# Patient Record
Sex: Male | Born: 1955
Health system: Southern US, Community
[De-identification: ages and names within clinical notes are randomized; demographics above are authoritative.]

## PROBLEM LIST (undated history)

## (undated) DIAGNOSIS — E785 Hyperlipidemia, unspecified: Secondary | ICD-10-CM

## (undated) DIAGNOSIS — G709 Myoneural disorder, unspecified: Secondary | ICD-10-CM

## (undated) DIAGNOSIS — C61 Malignant neoplasm of prostate: Secondary | ICD-10-CM

## (undated) DIAGNOSIS — I255 Ischemic cardiomyopathy: Secondary | ICD-10-CM

## (undated) DIAGNOSIS — Z87891 Personal history of nicotine dependence: Secondary | ICD-10-CM

## (undated) DIAGNOSIS — I2510 Atherosclerotic heart disease of native coronary artery without angina pectoris: Secondary | ICD-10-CM

## (undated) DIAGNOSIS — R001 Bradycardia, unspecified: Secondary | ICD-10-CM

## (undated) DIAGNOSIS — E781 Pure hyperglyceridemia: Secondary | ICD-10-CM

---

## 1999-04-17 ENCOUNTER — Encounter: Payer: Self-pay | Admitting: Orthopedic Surgery

## 1999-04-17 ENCOUNTER — Ambulatory Visit (HOSPITAL_COMMUNITY): Admission: RE | Admit: 1999-04-17 | Discharge: 1999-04-17 | Payer: Self-pay | Admitting: Orthopedic Surgery

## 2005-04-22 ENCOUNTER — Ambulatory Visit (HOSPITAL_COMMUNITY): Admission: RE | Admit: 2005-04-22 | Discharge: 2005-04-22 | Payer: Self-pay | Admitting: Orthopedic Surgery

## 2006-11-20 ENCOUNTER — Ambulatory Visit (HOSPITAL_COMMUNITY): Admission: RE | Admit: 2006-11-20 | Discharge: 2006-11-20 | Payer: Self-pay | Admitting: Family Medicine

## 2010-09-13 ENCOUNTER — Ambulatory Visit (HOSPITAL_COMMUNITY): Admission: RE | Admit: 2010-09-13 | Discharge: 2010-09-13 | Payer: Self-pay | Admitting: Family Medicine

## 2010-12-25 HISTORY — PX: CERVICAL SPINE SURGERY: SHX589

## 2011-06-11 ENCOUNTER — Telehealth: Payer: Self-pay

## 2011-06-11 DIAGNOSIS — Z139 Encounter for screening, unspecified: Secondary | ICD-10-CM

## 2011-06-11 MED ORDER — PEG 3350-KCL-NA BICARB-NACL 420 G PO SOLR: ORAL | Status: AC

## 2011-06-11 NOTE — Telephone Encounter (Signed)
Gastroenterology Pre-Procedure Form  Request Date: 06/11/2011     Requesting Physician: Dr. Sherwood Gambler     PATIENT INFORMATION:  Sean Paul is a 55 y.o., male (DOB=14-Apr-1956).  PROCEDURE: Procedure(s) requested: colonoscopy Procedure Reason: screening for colon cancer  PATIENT REVIEW QUESTIONS: The patient reports the following:   1. Diabetes Melitis: no 2. Joint replacements in the past 12 months: no 3. Major health problems in the past 3 months: no 4. Has an artificial valve or MVP:no 5. Has been advised in past to take antibiotics in advance of a procedure like teeth cleaning: no}    MEDICATIONS & ALLERGIES:    Patient reports the following regarding taking any blood thinners:   Plavix? no Aspirin?no Coumadin?  no  Patient confirms/reports the following medications:  Current Outpatient Prescriptions  Medication Sig Dispense Refill  . ALPRAZolam (XANAX) 1 MG tablet Take 1 mg by mouth at bedtime as needed.        Marland Kitchen ibuprofen (ADVIL,MOTRIN) 200 MG tablet Take 200 mg by mouth every 6 (six) hours as needed.        Marland Kitchen UNKNOWN TO PATIENT TESTOSTERONE INJECTION MONTHLY       . polyethylene glycol-electrolytes (TRILYTE) 420 G solution Use as directed Also buy 1 fleet enema & 4 dulcolax tablets to use as directed  4000 mL  0    Patient confirms/reports the following allergies:  Allergies  Allergen Reactions  . Penicillins Itching and Swelling    Patient is appropriate to schedule for requested procedure(s): yes  AUTHORIZATION INFORMATION Primary Insurance: ,  ID #:   Group #:  Pre-Cert / Auth required: Pre-Cert / Auth #:  Secondary Insurance: ,  ID #: ,  Group #:  Pre-Cert / Auth required: Pre-Cert / Auth #:  Orders Placed This Encounter  Procedures  . Procedural/ Surgical Case Request: COLONOSCOPY    Standing Status: Future     Number of Occurrences:      Standing Expiration Date: 06/10/2012    Order Specific Question:  Pre-op diagnosis    Answer:  N/A     SCHEDULE INFORMATION: Procedure has been scheduled as follows:  Date: 06/17/2011     Time: 10:15 AM  Location: [facility] Short Stay  This Gastroenterology Pre-Precedure Form is being routed to the following provider(s) for review: Lorenza Burton, NP

## 2011-06-11 NOTE — Telephone Encounter (Signed)
OK for colonoscopy.  

## 2011-06-14 MED ORDER — SODIUM CHLORIDE 0.45 % IV SOLN
Freq: Once | INTRAVENOUS | Status: AC
  Administered 2011-06-17: 10:00:00 via INTRAVENOUS

## 2011-06-17 ENCOUNTER — Ambulatory Visit (HOSPITAL_COMMUNITY)
Admission: RE | Admit: 2011-06-17 | Discharge: 2011-06-17 | Disposition: A | Discharge status: Home / Self Care | Payer: 59 | Source: Ambulatory Visit | Attending: Internal Medicine | Admitting: Internal Medicine

## 2011-06-17 ENCOUNTER — Encounter (HOSPITAL_COMMUNITY)
Admission: RE | Disposition: A | Discharge status: Home / Self Care | Payer: Self-pay | Source: Ambulatory Visit | Attending: Internal Medicine

## 2011-06-17 ENCOUNTER — Encounter (HOSPITAL_COMMUNITY): Payer: Self-pay | Admitting: *Deleted

## 2011-06-17 DIAGNOSIS — Z1211 Encounter for screening for malignant neoplasm of colon: Secondary | ICD-10-CM

## 2011-06-17 HISTORY — PX: COLONOSCOPY: SHX5424

## 2011-06-17 HISTORY — DX: Myoneural disorder, unspecified: G70.9

## 2011-06-17 SURGERY — COLONOSCOPY
Anesthesia: Moderate Sedation

## 2011-06-17 MED ORDER — MIDAZOLAM HCL 5 MG/5ML IJ SOLN
INTRAMUSCULAR | Status: AC
  Filled 2011-06-17: qty 10

## 2011-06-17 MED ORDER — MIDAZOLAM HCL 5 MG/5ML IJ SOLN
INTRAMUSCULAR | Status: DC | PRN
  Administered 2011-06-17 (×3): 2 mg via INTRAVENOUS

## 2011-06-17 MED ORDER — MEPERIDINE HCL 100 MG/ML IJ SOLN
INTRAMUSCULAR | Status: AC
  Filled 2011-06-17: qty 2

## 2011-06-17 MED ORDER — MEPERIDINE HCL 100 MG/ML IJ SOLN
INTRAMUSCULAR | Status: DC | PRN
  Administered 2011-06-17 (×3): 25 mg via INTRAVENOUS
  Administered 2011-06-17: 50 mg via INTRAVENOUS

## 2011-06-17 NOTE — H&P (Signed)
  Primary Care Physician:  No primary provider on file. Primary Gastroenterologist:  Dr.   Pre-Procedure History & Physical: HPI:  Sean Paul is a 55 y.o. male is here for his first ever a screening colonoscopy. No bowel symptoms. No family history of polyps or colon cancer.  Past Medical History  Diagnosis Date  . Neuromuscular disorder     Past Surgical History  Procedure Date  . Cervical spine surgery 12/25/2010    Prior to Admission medications   Medication Sig Start Date End Date Taking? Authorizing Provider  ALPRAZolam Prudy Feeler) 1 MG tablet Take 1 mg by mouth at bedtime as needed.     Yes Historical Provider, MD  UNKNOWN TO PATIENT TESTOSTERONE INJECTION MONTHLY    Yes Historical Provider, MD  ibuprofen (ADVIL,MOTRIN) 200 MG tablet Take 200 mg by mouth every 6 (six) hours as needed.      Historical Provider, MD    Allergies as of 06/11/2011 - Review Complete 06/11/2011  Allergen Reaction Noted  . Penicillins Itching and Swelling 06/11/2011    History reviewed. No pertinent family history.  History   Social History  . Marital Status: Single    Spouse Name: N/A    Number of Children: N/A  . Years of Education: N/A   Occupational History  . Not on file.   Social History Main Topics  . Smoking status: Not on file  . Smokeless tobacco: Not on file  . Alcohol Use: Yes  . Drug Use: No  . Sexually Active:    Other Topics Concern  . Not on file   Social History Narrative  . No narrative on file    Review of Systems: See HPI, otherwise negative ROS  Physical Exam: BP 119/72  Pulse 78  Temp(Src) 97.8 F (36.6 C) (Oral)  Resp 18  Ht 5\' 11"  (1.803 m)  Wt 179 lb (81.194 kg)  BMI 24.97 kg/m2  SpO2 97% General:   Alert,  Well-developed, well-nourished, pleasant and cooperative in NAD Head:  Normocephalic and atraumatic. Eyes:  Sclera clear, no icterus.   Conjunctiva pink. Ears:  Normal auditory acuity. Nose:  No deformity, discharge,  or  lesions. Mouth:  No deformity or lesions, dentition normal. Neck:  Supple; no masses or thyromegaly. Lungs:  Clear throughout to auscultation.   No wheezes, crackles, or rhonchi. No acute distress. Heart:  Regular rate and rhythm; no murmurs, clicks, rubs,  or gallops. Abdomen:  Soft, nontender and nondistended. No masses, hepatosplenomegaly or hernias noted. Normal bowel sounds, without guarding, and without rebound.   Msk:  Symmetrical without gross deformities. Normal posture. Pulses:  Normal pulses noted. Extremities:  Without clubbing or edema. Neurologic:  Alert and  oriented x4;  grossly normal neurologically. Skin:  Intact without significant lesions or rashes. Cervical Nodes:  No significant cervical adenopathy. Psych:  Alert and cooperative. Normal mood and affect.  Impression/Plan: Sean Paul is now here to undergo a average risk screening colonoscopy.  Risks, benefits, limitations, imponderables and alternatives regarding colonoscopy have been reviewed with the patient. Questions have been answered. All parties agreeable.

## 2011-06-20 ENCOUNTER — Encounter (HOSPITAL_COMMUNITY): Payer: Self-pay | Admitting: Internal Medicine

## 2015-06-06 ENCOUNTER — Inpatient Hospital Stay (HOSPITAL_COMMUNITY)
Admission: EM | Admit: 2015-06-06 | Discharge: 2015-06-10 | DRG: 247 | Disposition: A | Discharge status: Home / Self Care | Payer: 59 | Attending: Interventional Cardiology | Admitting: Interventional Cardiology

## 2015-06-06 ENCOUNTER — Encounter (HOSPITAL_COMMUNITY): Payer: Self-pay | Admitting: Emergency Medicine

## 2015-06-06 ENCOUNTER — Emergency Department (HOSPITAL_COMMUNITY): Payer: 59

## 2015-06-06 DIAGNOSIS — Z880 Allergy status to penicillin: Secondary | ICD-10-CM | POA: Diagnosis not present

## 2015-06-06 DIAGNOSIS — I214 Non-ST elevation (NSTEMI) myocardial infarction: Secondary | ICD-10-CM | POA: Diagnosis present

## 2015-06-06 DIAGNOSIS — I255 Ischemic cardiomyopathy: Secondary | ICD-10-CM | POA: Diagnosis present

## 2015-06-06 DIAGNOSIS — R7989 Other specified abnormal findings of blood chemistry: Secondary | ICD-10-CM | POA: Diagnosis not present

## 2015-06-06 DIAGNOSIS — R001 Bradycardia, unspecified: Secondary | ICD-10-CM | POA: Diagnosis present

## 2015-06-06 DIAGNOSIS — I2510 Atherosclerotic heart disease of native coronary artery without angina pectoris: Secondary | ICD-10-CM | POA: Diagnosis present

## 2015-06-06 DIAGNOSIS — I495 Sick sinus syndrome: Secondary | ICD-10-CM | POA: Diagnosis not present

## 2015-06-06 DIAGNOSIS — R778 Other specified abnormalities of plasma proteins: Secondary | ICD-10-CM

## 2015-06-06 DIAGNOSIS — E781 Pure hyperglyceridemia: Secondary | ICD-10-CM | POA: Diagnosis present

## 2015-06-06 DIAGNOSIS — Z981 Arthrodesis status: Secondary | ICD-10-CM

## 2015-06-06 DIAGNOSIS — Z87891 Personal history of nicotine dependence: Secondary | ICD-10-CM | POA: Diagnosis not present

## 2015-06-06 DIAGNOSIS — Z8249 Family history of ischemic heart disease and other diseases of the circulatory system: Secondary | ICD-10-CM | POA: Diagnosis not present

## 2015-06-06 DIAGNOSIS — Z79899 Other long term (current) drug therapy: Secondary | ICD-10-CM

## 2015-06-06 DIAGNOSIS — Z7982 Long term (current) use of aspirin: Secondary | ICD-10-CM

## 2015-06-06 HISTORY — DX: Hyperlipidemia, unspecified: E78.5

## 2015-06-06 HISTORY — DX: Bradycardia, unspecified: R00.1

## 2015-06-06 HISTORY — DX: Atherosclerotic heart disease of native coronary artery without angina pectoris: I25.10

## 2015-06-06 HISTORY — DX: Ischemic cardiomyopathy: I25.5

## 2015-06-06 HISTORY — DX: Personal history of nicotine dependence: Z87.891

## 2015-06-06 HISTORY — DX: Pure hyperglyceridemia: E78.1

## 2015-06-06 LAB — COMPREHENSIVE METABOLIC PANEL
ALBUMIN: 4.7 g/dL (ref 3.5–5.0)
ALK PHOS: 72 U/L (ref 38–126)
ALT: 21 U/L (ref 17–63)
AST: 28 U/L (ref 15–41)
Anion gap: 12 (ref 5–15)
BILIRUBIN TOTAL: 0.6 mg/dL (ref 0.3–1.2)
BUN: 12 mg/dL (ref 6–20)
CHLORIDE: 99 mmol/L — AB (ref 101–111)
CO2: 24 mmol/L (ref 22–32)
Calcium: 9.1 mg/dL (ref 8.9–10.3)
Creatinine, Ser: 0.74 mg/dL (ref 0.61–1.24)
GFR calc non Af Amer: >60 mL/min (ref >60)
GLUCOSE: 155 mg/dL — AB (ref 65–99)
POTASSIUM: 3.7 mmol/L (ref 3.5–5.1)
Sodium: 135 mmol/L (ref 135–145)
TOTAL PROTEIN: 8.3 g/dL — AB (ref 6.5–8.1)

## 2015-06-06 LAB — PROTIME-INR
INR: 1.07 (ref 0.00–1.49)
PROTHROMBIN TIME: 14.1 s (ref 11.6–15.2)

## 2015-06-06 LAB — TROPONIN I
TROPONIN I: 2.1 ng/mL — AB (ref <0.031)
Troponin I: 0.13 ng/mL — ABNORMAL HIGH (ref <0.031)

## 2015-06-06 LAB — CBC
HCT: 41.9 % (ref 39.0–52.0)
HEMOGLOBIN: 14.9 g/dL (ref 13.0–17.0)
MCH: 33.2 pg (ref 26.0–34.0)
MCHC: 35.6 g/dL (ref 30.0–36.0)
MCV: 93.3 fL (ref 78.0–100.0)
PLATELETS: 222 10*3/uL (ref 150–400)
RBC: 4.49 MIL/uL (ref 4.22–5.81)
RDW: 11.6 % (ref 11.5–15.5)
WBC: 8.1 10*3/uL (ref 4.0–10.5)

## 2015-06-06 LAB — APTT: aPTT: 27 seconds (ref 24–37)

## 2015-06-06 MED ORDER — MORPHINE SULFATE 4 MG/ML IJ SOLN
INTRAMUSCULAR | Status: AC
  Filled 2015-06-06: qty 1

## 2015-06-06 MED ORDER — ATORVASTATIN CALCIUM 80 MG PO TABS
80.0000 mg | ORAL_TABLET | Freq: Every day | ORAL | Status: DC
  Administered 2015-06-07 – 2015-06-09 (×3): 80 mg via ORAL
  Filled 2015-06-06 (×4): qty 1

## 2015-06-06 MED ORDER — TIROFIBAN (AGGRASTAT) BOLUS VIA INFUSION
25.0000 ug/kg | Freq: Once | INTRAVENOUS | Status: AC
  Administered 2015-06-07: 2155 ug via INTRAVENOUS
  Filled 2015-06-06: qty 44

## 2015-06-06 MED ORDER — SODIUM CHLORIDE 0.9 % IV SOLN
1000.0000 mL | INTRAVENOUS | Status: DC
  Administered 2015-06-06: 1000 mL via INTRAVENOUS

## 2015-06-06 MED ORDER — ONDANSETRON HCL 4 MG/2ML IJ SOLN: 4.0000 mg | Freq: Four times a day (QID) | INTRAMUSCULAR | Status: DC | PRN

## 2015-06-06 MED ORDER — ACETAMINOPHEN 325 MG PO TABS: 650.0000 mg | ORAL_TABLET | ORAL | Status: DC | PRN

## 2015-06-06 MED ORDER — ENOXAPARIN SODIUM 100 MG/ML ~~LOC~~ SOLN
90.0000 mg | Freq: Two times a day (BID) | SUBCUTANEOUS | Status: DC
  Administered 2015-06-06 – 2015-06-08 (×4): 90 mg via SUBCUTANEOUS
  Filled 2015-06-06 (×8): qty 1

## 2015-06-06 MED ORDER — MORPHINE SULFATE 4 MG/ML IJ SOLN
4.0000 mg | Freq: Once | INTRAMUSCULAR | Status: AC
  Administered 2015-06-06: 4 mg via INTRAVENOUS

## 2015-06-06 MED ORDER — NITROGLYCERIN IN D5W 200-5 MCG/ML-% IV SOLN
20.0000 ug/min | INTRAVENOUS | Status: DC
  Administered 2015-06-07: 20 ug/min via INTRAVENOUS
  Filled 2015-06-06: qty 250

## 2015-06-06 MED ORDER — NITROGLYCERIN 2 % TD OINT
1.0000 [in_us] | TOPICAL_OINTMENT | Freq: Once | TRANSDERMAL | Status: AC
  Administered 2015-06-06: 1 [in_us] via TOPICAL
  Filled 2015-06-06: qty 1

## 2015-06-06 MED ORDER — METOPROLOL TARTRATE 12.5 MG HALF TABLET
12.5000 mg | ORAL_TABLET | Freq: Two times a day (BID) | ORAL | Status: DC
  Administered 2015-06-07 (×3): 12.5 mg via ORAL
  Filled 2015-06-06 (×7): qty 1

## 2015-06-06 MED ORDER — NITROGLYCERIN 0.4 MG SL SUBL
0.4000 mg | SUBLINGUAL_TABLET | SUBLINGUAL | Status: DC | PRN
Start: 2015-06-06 — End: 2015-06-10
  Administered 2015-06-06: 0.4 mg via SUBLINGUAL
  Filled 2015-06-06: qty 1

## 2015-06-06 MED ORDER — ASPIRIN EC 81 MG PO TBEC
81.0000 mg | DELAYED_RELEASE_TABLET | Freq: Every day | ORAL | Status: DC
  Administered 2015-06-08 – 2015-06-10 (×2): 81 mg via ORAL
  Filled 2015-06-06 (×5): qty 1

## 2015-06-06 MED ORDER — TIROFIBAN HCL IV 5 MG/100ML
0.1500 ug/kg/min | INTRAVENOUS | Status: DC
  Administered 2015-06-07 – 2015-06-09 (×7): 0.15 ug/kg/min via INTRAVENOUS
  Filled 2015-06-06 (×24): qty 100

## 2015-06-06 MED ORDER — ASPIRIN 81 MG PO CHEW
324.0000 mg | CHEWABLE_TABLET | Freq: Once | ORAL | Status: AC
  Administered 2015-06-06: 324 mg via ORAL
  Filled 2015-06-06: qty 4

## 2015-06-06 NOTE — Progress Notes (Signed)
ANTICOAGULATION CONSULT NOTE - Initial Consult  Pharmacy Consult for lovenox Indication: chest pain/ACS  Allergies  Allergen Reactions  . Penicillins Itching and Swelling    Patient Measurements: Height: 5\' 10"  (177.8 cm) Weight: 190 lb (86.183 kg) IBW/kg (Calculated) : 73  Vital Signs: Temp: 97.5 F (36.4 C) (08/09 1747) Temp Source: Oral (08/09 1747) BP: 112/71 mmHg (08/09 1900) Pulse Rate: 58 (08/09 1900)  Labs:  Recent Labs  06/06/15 1808  HGB 14.9  HCT 41.9  PLT 222  APTT 27  LABPROT 14.1  INR 1.07  CREATININE 0.74  TROPONINI 0.13*    Estimated Creatinine Clearance: 101.4 mL/min (by C-G formula based on Cr of 0.74).   Medical History: Past Medical History  Diagnosis Date  . Neuromuscular disorder     Medications:  See medication history  Assessment: 59 yo man to start lovenox for CP. Goal of Therapy:  Anti-Xa level 0.6-1 units/ml 4hrs after LMWH dose given Monitor platelets by anticoagulation protocol: Yes   Plan:  Lovenox 90 mg sq q12 hours F/u CBC  Nyheem Binette Poteet 06/06/2015,7:57 PM

## 2015-06-06 NOTE — Progress Notes (Signed)
HPI: 59 yo man with hypertryglyceridemia and remote tobacco use (quit 25 yrs ago) presented to [facility] ED with CP.  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  These episodes resolved quickly with rest and he attributed to over-exertion.  However, today around 1:30 he developed moderate chest discomfort.  Described as heavy/tight feeling center, lower chest.  Associated dyspnea and nausea while working.  This resolved after about 20 minutes.  However, once he got to working again pain recurred and this time lasted a few hours with mild intensity.  He drove home from [facility] and presented to [facility] ED.  There, his ECG showed SR with possible inferior Qs albeit small and mildly elevated troponon of 0.016 prompting request for transfer.    On arrival here, he reports mild recurrence of his chest discomfort.  Troponin up to 2.  ECG with inferior Q waves and TWI that are new from previous.     Review of Systems:     Cardiac Review of Systems: {Y] = yes [ ]  = no  Chest Pain [x    ]  Resting SOB [x   ] Exertional SOB  [x  ]  Orthopnea [  ]   Pedal Edema [   ]    Palpitations [  ] Syncope  [  ]   Presyncope [   ]  General Review of Systems: [Y] = yes [  ]=no Constitional: recent weight change [  ]; anorexia [  ]; fatigue [  ]; nausea [  ]; night sweats [  ]; fever [  ]; or chills [  ];                                                                      Dental: poor dentition[  ];   Eye : blurred vision [  ]; diplopia [   ]; vision changes [  ];  Amaurosis fugax[  ]; Resp: cough [  ];  wheezing[  ];  hemoptysis[  ]; shortness of breath[  ]; paroxysmal nocturnal dyspnea[  ]; dyspnea on exertion[  ]; or orthopnea[  ];  GI:  gallstones[  ], vomiting[  ];  dysphagia[  ]; melena[  ];  hematochezia [  ]; heartburn[  ];   GU: kidney stones [  ]; hematuria[  ];    dysuria [  ];  nocturia[  ];               Skin: rash [  ], swelling[  ];, hair loss[  ];  peripheral edema[  ];  or itching[  ]; Musculosketetal: myalgias[  ];  joint swelling[  ];  joint erythema[  ];  joint pain[  ];  back pain[  ];  Heme/Lymph: bruising[  ];  bleeding[  ];  anemia[  ];  Neuro: TIA[  ];  headaches[  ];  stroke[  ];  vertigo[  ];  seizures[  ];   paresthesias[  ];  difficulty walking[  ];  Psych:depression[  ]; anxiety[  ];  Endocrine: diabetes[  ];  thyroid dysfunction[  ];  Other:  Past Medical History  Diagnosis Date  . Neuromuscular disorder     prior cervical fusion    No current facility-administered medications on file prior to encounter.   No current outpatient prescriptions on file prior to encounter.     Allergies  Allergen Reactions  . Penicillins Itching and Swelling    History   Social History  . Marital Status: Single    Spouse Name: N/A  . Number of Children: N/A  . Years of Education: N/A   Occupational History  . Not on file.   Social History Main Topics  . Smoking status: Former Smoker    Types: Cigarettes  . Smokeless tobacco: Not on file  . Alcohol Use: Yes  . Drug Use: No  . Sexual Activity: Not on file   Other Topics Concern  . Not on file   Social History Narrative    Family History  Problem Relation Age of Onset  . CAD Brother     PHYSICAL EXAM: Filed Vitals:   06/06/15 2250  BP: 107/64  Pulse: 75  Temp: 97.7 F (36.5 C)  Resp: 16   General:  Well appearing. No respiratory difficulty HEENT: normal Neck: supple. no JVD. Carotids 2+ bilat; no bruits. No lymphadenopathy or thryomegaly appreciated. Cor: PMI nondisplaced. Regular rate & rhythm. No rubs, gallops or murmurs. Lungs: clear Abdomen: soft, nontender, nondistended. No hepatosplenomegaly. No bruits or masses. Good bowel sounds. Extremities: no cyanosis, clubbing, rash, edema Neuro: alert & oriented x 3, cranial nerves grossly intact. moves all 4  extremities w/o difficulty. Affect pleasant.  ECG: SR with inferior Q in III and aVF and new TWI inferior leads  Results for orders placed or performed during the hospital encounter of 06/06/15 (from the past 24 hour(s))  APTT     Status: None   Collection Time: 06/06/15  6:08 PM  Result Value Ref Range   aPTT 27 24 - 37 seconds  CBC     Status: None   Collection Time: 06/06/15  6:08 PM  Result Value Ref Range   WBC 8.1 4.0 - 10.5 K/uL   RBC 4.49 4.22 - 5.81 MIL/uL   Hemoglobin 14.9 13.0 - 17.0 g/dL   HCT 41.9 39.0 - 52.0 %   MCV 93.3 78.0 - 100.0 fL   MCH 33.2 26.0 - 34.0 pg   MCHC 35.6 30.0 - 36.0 g/dL   RDW 11.6 11.5 - 15.5 %   Platelets 222 150 - 400 K/uL  Comprehensive metabolic panel     Status: Abnormal   Collection Time: 06/06/15  6:08 PM  Result Value Ref Range   Sodium 135 135 - 145 mmol/L   Potassium 3.7 3.5 - 5.1 mmol/L   Chloride 99 (L) 101 - 111 mmol/L   CO2 24 22 - 32 mmol/L   Glucose, Bld 155 (H) 65 - 99 mg/dL   BUN 12 6 - 20 mg/dL   Creatinine, Ser 0.74 0.61 - 1.24 mg/dL   Calcium 9.1 8.9 - 10.3 mg/dL   Total Protein 8.3 (H) 6.5 - 8.1 g/dL   Albumin 4.7 3.5 - 5.0 g/dL   AST 28 15 - 41 U/L   ALT 21 17 - 63 U/L   Alkaline Phosphatase 72 38 - 126 U/L   Total Bilirubin 0.6 0.3 - 1.2 mg/dL   GFR calc non Af Amer >60 >60 mL/min   GFR calc Af Amer >60 >60 mL/min   Anion gap 12 5 -  15  Protime-INR     Status: None   Collection Time: 06/06/15  6:08 PM  Result Value Ref Range   Prothrombin Time 14.1 11.6 - 15.2 seconds   INR 1.07 0.00 - 1.49  Troponin I  (0, 3, 6)     Status: Abnormal   Collection Time: 06/06/15  6:08 PM  Result Value Ref Range   Troponin I 0.13 (H) <0.031 ng/mL  Troponin I  (0, 3, 6)     Status: Abnormal   Collection Time: 06/06/15  8:50 PM  Result Value Ref Range   Troponin I 2.10 (HH) <0.031 ng/mL   Dg Chest Portable 1 View  06/06/2015   CLINICAL DATA:  Chest pain. Central chest pressure for 1 week. Pressure became pain today. Initial  encounter. Intermittent shortness of breath. Nausea.  EXAM: PORTABLE CHEST - 1 VIEW  COMPARISON:  None.  FINDINGS: Cardiopericardial silhouette within normal limits. Mediastinal contours normal. Trachea midline. No airspace disease or effusion. Monitoring leads project over the chest. Lower cervical ACDF.  IMPRESSION: No active disease.   Electronically Signed   By: Dereck Ligas M.D.   On: 06/06/2015 18:16     ASSESSMENT: 59 yo man with hyper-TGemia and remote tobacco use with 1 week of UA presenting with longer duration of chest discomfort, dynamic ECG changes and rising troponin c/w NSTEMI  PLAN/DISCUSSION: ASA, full dose lovenox given and will continue Will initiate GP IIb/IIIa inhibitor given ongoing chest discomfort and ECG changes Initiate NTG drip, may be limited by BP Low dose BB (metop 12.5) High dose statin Risk stratify Echo tomorrow NPO for cath in AM, sooner if pain not resolved with medical therapy Plan discussed with Dr. Irish Lack given potential for cath tonight if symptoms resolution not achieved

## 2015-06-06 NOTE — ED Notes (Signed)
Pt c/o of LT sided non-radiating chest pain that began today with SOB. Pt AOx4.

## 2015-06-06 NOTE — ED Notes (Signed)
CRITICAL VALUE ALERT  Critical value received:  Troponin 2.10  Date of notification:  06/06/15  Time of notification:  2153  Critical value read back:Yes.    Nurse who received alert:  Derek Mound, RN  MD notified (1st page):  Tomi Bamberger  Time of first page:  2154  MD notified (2nd page):  Time of second page:  Responding MD:  Tomi Bamberger  Time MD responded:  2155

## 2015-06-06 NOTE — ED Provider Notes (Signed)
CSN: [id number]     Arrival date & time 06/06/15  1732 History  First MD Initiated Contact with Patient 06/06/15 1736     Chief Complaint  Patient presents with  . Chest Pain   Patient is a 59 y.o. male presenting with chest pain. The history is provided by the patient.  Chest Pain Pain location:  Substernal area Pain quality: burning and pressure   Pain radiates to:  Does not radiate Pain radiates to the back: no   Pain severity:  Moderate Duration:  5 hours (He has noticed the pain off and on over the last week.  Usually when exerting himself.  Today it has been worse and has not gone away.) Timing:  Intermittent Progression:  Worsening Chronicity:  Recurrent Context: movement   Relieved by:  Rest Worsened by:  Exertion Ineffective treatments:  None tried Associated symptoms: nausea and shortness of breath   Associated symptoms: no abdominal pain, no anxiety, no back pain, no cough, no fever and not vomiting   Risk factors: high cholesterol   Risk factors: no coronary artery disease, no diabetes mellitus, no hypertension and no prior DVT/PE     Past Medical History  Diagnosis Date  . Neuromuscular disorder     prior cervical fusion   Past Surgical History  Procedure Laterality Date  . Cervical spine surgery  12/25/2010  . Colonoscopy  06/17/2011    Procedure: COLONOSCOPY;  Surgeon: Daneil Dolin, MD;  Location: [facility]ITE;  Service: Endoscopy;  Laterality: N/A;  10:15 AM   Family History  Problem Relation Age of Onset  . CAD Brother    History  Substance Use Topics  . Smoking status: Former Smoker    Types: Cigarettes  . Smokeless tobacco: Not on file  . Alcohol Use: Yes    Review of Systems  Constitutional: Negative for fever.  Respiratory: Positive for shortness of breath. Negative for cough.   Cardiovascular: Positive for chest pain.  Gastrointestinal: Positive for nausea. Negative for vomiting and abdominal pain.  Musculoskeletal: Negative for back pain.   All other systems reviewed and are negative.     Allergies  Penicillins  Home Medications   Prior to Admission medications   Medication Sig Start Date End Date Taking? Authorizing Provider  allopurinol (ZYLOPRIM) 100 MG tablet Take 50 mg by mouth daily.  05/31/15  Yes Historical Provider, MD  aspirin EC 325 MG tablet Take 325 mg by mouth every other day.   Yes Historical Provider, MD  KRILL OIL PO Take 1 tablet by mouth daily.   Yes Historical Provider, MD  NIASPAN 1000 MG CR tablet Take 1,000 mg by mouth daily. 05/15/15  Yes Historical Provider, MD   BP 112/71 mmHg  Pulse 58  Temp(Src) 97.5 F (36.4 C) (Oral)  Resp 12  Ht 5\' 10"  (1.778 m)  Wt 190 lb (86.183 kg)  BMI 27.26 kg/m2  SpO2 98% Physical Exam  Constitutional: He appears well-developed and well-nourished. No distress.  HENT:  Head: Normocephalic and atraumatic.  Right Ear: External ear normal.  Left Ear: External ear normal.  Eyes: Conjunctivae are normal. Right eye exhibits no discharge. Left eye exhibits no discharge. No scleral icterus.  Neck: Neck supple. No tracheal deviation present.  Cardiovascular: Normal rate, regular rhythm and intact distal pulses.   Pulmonary/Chest: Effort normal and breath sounds normal. No stridor. No respiratory distress. He has no wheezes. He has no rales.  Abdominal: Soft. Bowel sounds are normal. He exhibits no distension. There  is no tenderness. There is no rebound and no guarding.  Musculoskeletal: He exhibits no edema or tenderness.  Neurological: He is alert. He has normal strength. No cranial nerve deficit (no facial droop, extraocular movements intact, no slurred speech) or sensory deficit. He exhibits normal muscle tone. He displays no seizure activity. Coordination normal.  Skin: Skin is warm and dry. No rash noted.  Psychiatric: He has a normal mood and affect.  Nursing note and vitals reviewed.   ED Course  Procedures (including critical care time) Labs Review Labs  Reviewed  COMPREHENSIVE METABOLIC PANEL - Abnormal; Notable for the following:    Chloride 99 (*)    Glucose, Bld 155 (*)    Total Protein 8.3 (*)    All other components within normal limits  TROPONIN I - Abnormal; Notable for the following:    Troponin I 0.13 (*)    All other components within normal limits  APTT  CBC  PROTIME-INR  TROPONIN I  TROPONIN I    Imaging Review Dg Chest Portable 1 View  06/06/2015   CLINICAL DATA:  Chest pain. Central chest pressure for 1 week. Pressure became pain today. Initial encounter. Intermittent shortness of breath. Nausea.  EXAM: PORTABLE CHEST - 1 VIEW  COMPARISON:  None.  FINDINGS: Cardiopericardial silhouette within normal limits. Mediastinal contours normal. Trachea midline. No airspace disease or effusion. Monitoring leads project over the chest. Lower cervical ACDF.  IMPRESSION: No active disease.   Electronically Signed   By: Dereck Ligas M.D.   On: 06/06/2015 18:16     EKG Interpretation   Date/Time:  Tuesday June 06 2015 17:47:12 EDT Ventricular Rate:  71 PR Interval:  141 QRS Duration: 128 QT Interval:  408 QTC Calculation: 443 R Axis:   73 Text Interpretation:  Sinus rhythm Left bundle branch block Baseline  wander in lead(s) I III V3 similar appearance to prior ECG except for  increased qrs duration Confirmed by Esha Fincher  MD-J, Maiah Sinning ([id number]) on 06/06/2015  5:55:59 PM     Medications  0.9 %  sodium chloride infusion (1,000 mLs Intravenous New Bag/Given 06/06/15 1802)  nitroGLYCERIN (NITROSTAT) SL tablet 0.4 mg (not administered)  enoxaparin (LOVENOX) injection 90 mg (90 mg Subcutaneous Given 06/06/15 2003)  aspirin chewable tablet 324 mg (324 mg Oral Given 06/06/15 1802)  nitroGLYCERIN (NITROGLYN) 2 % ointment 1 inch (1 inch Topical Given 06/06/15 1802)    MDM   Final diagnoses:  NSTEMI (non-ST elevated myocardial infarction)    The patient no longer has any chest pain after he was given aspirin and nitroglycerin. Patient's  laboratory tests do show an elevated troponin of 0.13. I'm concerned about the possibility of unstable angina as well as a non-ST elevation myocardial infarction. The patient will need to be admitted to the hospital for further testing and evaluation.  D/w Dr Philbert Riser.  Will plan on transfer to [facility] hospital for admission.    Dorie Rank, MD 06/06/15 2017

## 2015-06-07 ENCOUNTER — Encounter (HOSPITAL_COMMUNITY)
Admission: EM | Disposition: A | Discharge status: Home / Self Care | Payer: Self-pay | Source: Home / Self Care | Attending: Interventional Cardiology

## 2015-06-07 ENCOUNTER — Encounter (HOSPITAL_COMMUNITY): Payer: Self-pay | Admitting: *Deleted

## 2015-06-07 DIAGNOSIS — I2510 Atherosclerotic heart disease of native coronary artery without angina pectoris: Secondary | ICD-10-CM

## 2015-06-07 DIAGNOSIS — I495 Sick sinus syndrome: Secondary | ICD-10-CM

## 2015-06-07 DIAGNOSIS — E781 Pure hyperglyceridemia: Secondary | ICD-10-CM

## 2015-06-07 HISTORY — PX: CARDIAC CATHETERIZATION: SHX172

## 2015-06-07 LAB — TSH: TSH: 1.076 u[IU]/mL (ref 0.350–4.500)

## 2015-06-07 LAB — BASIC METABOLIC PANEL
Anion gap: 8 (ref 5–15)
BUN: 7 mg/dL (ref 6–20)
CALCIUM: 8.4 mg/dL — AB (ref 8.9–10.3)
CO2: 22 mmol/L (ref 22–32)
Chloride: 105 mmol/L (ref 101–111)
Creatinine, Ser: 0.64 mg/dL (ref 0.61–1.24)
GFR calc Af Amer: >60 mL/min (ref >60)
GLUCOSE: 105 mg/dL — AB (ref 65–99)
POTASSIUM: 3.3 mmol/L — AB (ref 3.5–5.1)
Sodium: 135 mmol/L (ref 135–145)

## 2015-06-07 LAB — TROPONIN I
TROPONIN I: 22.86 ng/mL — AB (ref <0.031)
Troponin I: 5.47 ng/mL (ref <0.031)
Troponin I: 15.59 ng/mL (ref <0.031)

## 2015-06-07 LAB — CBC
HEMATOCRIT: 34.8 % — AB (ref 39.0–52.0)
Hemoglobin: 12.2 g/dL — ABNORMAL LOW (ref 13.0–17.0)
MCH: 33 pg (ref 26.0–34.0)
MCHC: 35.1 g/dL (ref 30.0–36.0)
MCV: 94.1 fL (ref 78.0–100.0)
Platelets: 183 10*3/uL (ref 150–400)
RBC: 3.7 MIL/uL — ABNORMAL LOW (ref 4.22–5.81)
RDW: 11.9 % (ref 11.5–15.5)
WBC: 6 10*3/uL (ref 4.0–10.5)

## 2015-06-07 LAB — MRSA PCR SCREENING: MRSA by PCR: NEGATIVE

## 2015-06-07 SURGERY — LEFT HEART CATH AND CORONARY ANGIOGRAPHY
Anesthesia: LOCAL

## 2015-06-07 MED ORDER — IOHEXOL 350 MG/ML SOLN
INTRAVENOUS | Status: DC | PRN
  Administered 2015-06-07: 65 mL via INTRA_ARTERIAL

## 2015-06-07 MED ORDER — FENTANYL CITRATE (PF) 100 MCG/2ML IJ SOLN
INTRAMUSCULAR | Status: DC | PRN
  Administered 2015-06-07: 25 ug via INTRAVENOUS

## 2015-06-07 MED ORDER — MIDAZOLAM HCL 2 MG/2ML IJ SOLN
INTRAMUSCULAR | Status: DC | PRN
  Administered 2015-06-07: 1 mg via INTRAVENOUS

## 2015-06-07 MED ORDER — ONDANSETRON HCL 4 MG/2ML IJ SOLN: 4.0000 mg | Freq: Four times a day (QID) | INTRAMUSCULAR | Status: DC | PRN

## 2015-06-07 MED ORDER — MIDAZOLAM HCL 2 MG/2ML IJ SOLN
INTRAMUSCULAR | Status: AC
  Filled 2015-06-07: qty 4

## 2015-06-07 MED ORDER — SODIUM CHLORIDE 0.9 % WEIGHT BASED INFUSION
1.0000 mL/kg/h | INTRAVENOUS | Status: AC
  Administered 2015-06-07: 1 mL/kg/h via INTRAVENOUS

## 2015-06-07 MED ORDER — SODIUM CHLORIDE 0.9 % IJ SOLN
3.0000 mL | INTRAMUSCULAR | Status: DC | PRN
Start: 2015-06-07 — End: 2015-06-10

## 2015-06-07 MED ORDER — SODIUM CHLORIDE 0.9 % IV SOLN
INTRAVENOUS | Status: DC
  Administered 2015-06-07 – 2015-06-08 (×2): via INTRAVENOUS

## 2015-06-07 MED ORDER — VERAPAMIL HCL 2.5 MG/ML IV SOLN
INTRAVENOUS | Status: AC
  Filled 2015-06-07: qty 2

## 2015-06-07 MED ORDER — ASPIRIN 81 MG PO CHEW
81.0000 mg | CHEWABLE_TABLET | Freq: Every day | ORAL | Status: DC
  Administered 2015-06-07: 81 mg via ORAL
  Filled 2015-06-07: qty 1

## 2015-06-07 MED ORDER — HEPARIN SODIUM (PORCINE) 1000 UNIT/ML IJ SOLN
INTRAMUSCULAR | Status: AC
  Filled 2015-06-07: qty 1

## 2015-06-07 MED ORDER — NITROGLYCERIN 1 MG/10 ML FOR IR/CATH LAB
INTRA_ARTERIAL | Status: AC
  Filled 2015-06-07: qty 10

## 2015-06-07 MED ORDER — HEPARIN SODIUM (PORCINE) 1000 UNIT/ML IJ SOLN
INTRAMUSCULAR | Status: DC | PRN
  Administered 2015-06-07: 4500 [IU] via INTRAVENOUS

## 2015-06-07 MED ORDER — VERAPAMIL HCL 2.5 MG/ML IV SOLN
INTRAVENOUS | Status: DC | PRN
  Administered 2015-06-07: 03:00:00 via INTRA_ARTERIAL

## 2015-06-07 MED ORDER — SODIUM CHLORIDE 0.9 % IV SOLN: 250.0000 mL | INTRAVENOUS | Status: DC | PRN

## 2015-06-07 MED ORDER — ACETAMINOPHEN 325 MG PO TABS: 650.0000 mg | ORAL_TABLET | ORAL | Status: DC | PRN

## 2015-06-07 MED ORDER — LIDOCAINE HCL (PF) 1 % IJ SOLN
INTRAMUSCULAR | Status: AC
  Filled 2015-06-07: qty 30

## 2015-06-07 MED ORDER — LIDOCAINE HCL (PF) 1 % IJ SOLN
INTRAMUSCULAR | Status: DC | PRN
  Administered 2015-06-07: 03:00:00

## 2015-06-07 MED ORDER — SODIUM CHLORIDE 0.9 % IJ SOLN
3.0000 mL | Freq: Two times a day (BID) | INTRAMUSCULAR | Status: DC
  Administered 2015-06-07 – 2015-06-09 (×4): 3 mL via INTRAVENOUS

## 2015-06-07 MED ORDER — FENTANYL CITRATE (PF) 100 MCG/2ML IJ SOLN
INTRAMUSCULAR | Status: AC
  Filled 2015-06-07: qty 4

## 2015-06-07 MED ORDER — ISOSORBIDE MONONITRATE 15 MG HALF TABLET
15.0000 mg | ORAL_TABLET | Freq: Every day | ORAL | Status: DC
  Administered 2015-06-07: 15 mg via ORAL
  Filled 2015-06-07 (×2): qty 1

## 2015-06-07 SURGICAL SUPPLY — 12 items
CATH INFINITI 5 FR JL3.5 (CATHETERS) ×2 IMPLANT
CATH INFINITI 5FR ANG PIGTAIL (CATHETERS) ×2 IMPLANT
CATH INFINITI JR4 5F (CATHETERS) ×2 IMPLANT
DEVICE RAD COMP TR BAND LRG (VASCULAR PRODUCTS) ×2 IMPLANT
ELECT DEFIB PAD ADLT CADENCE (PAD) ×1 IMPLANT
GLIDESHEATH SLEND SS 6F .021 (SHEATH) ×2 IMPLANT
KIT HEART LEFT (KITS) ×2 IMPLANT
PACK CARDIAC CATHETERIZATION (CUSTOM PROCEDURE TRAY) ×2 IMPLANT
SYR MEDRAD MARK V 150ML (SYRINGE) ×2 IMPLANT
TRANSDUCER W/STOPCOCK (MISCELLANEOUS) ×2 IMPLANT
TUBING CIL FLEX 10 FLL-RA (TUBING) ×2 IMPLANT
WIRE SAFE-T 1.5MM-J .035X260CM (WIRE) ×2 IMPLANT

## 2015-06-07 NOTE — Progress Notes (Signed)
Subjective:  Day 1 s/p NSTEMI; no chest pain  Objective:   Vital Signs : Filed Vitals:   06/07/15 0500 06/07/15 0730 06/07/15 0745 06/07/15 0800  BP:  '94/49 99/50 95/56 '  Pulse:  55 55 61  Temp: 97.9 F (36.6 C) 97.7 F (36.5 C)    TempSrc: Oral Axillary    Resp:  '9 10 14  ' Height:      Weight: 175 lb 14.8 oz (79.8 kg)     SpO2:  99% 99% 100%    Intake/Output from previous day:  Intake/Output Summary (Last 24 hours) at 06/07/15 0827 Last data filed at 06/07/15 0700  Gross per 24 hour  Intake  468.1 ml  Output      0 ml  Net  468.1 ml    I/O since admission: +468  Wt Readings from Last 3 Encounters:  06/07/15 175 lb 14.8 oz (79.8 kg)  06/17/11 179 lb (81.194 kg)    Medications: . aspirin  81 mg Oral Daily  . aspirin EC  81 mg Oral Daily  . atorvastatin  80 mg Oral q1800  . enoxaparin (LOVENOX) injection  90 mg Subcutaneous Q12H  . metoprolol tartrate  12.5 mg Oral BID  . sodium chloride  3 mL Intravenous Q12H    . sodium chloride 1,000 mL (06/06/15 1802)  . nitroGLYCERIN 10 mcg/min (06/07/15 0600)  . tirofiban 0.15 mcg/kg/min (06/07/15 0753)    Physical Exam:   General appearance: alert, cooperative and no distress Neck: no adenopathy, no carotid bruit, no JVD, supple, symmetrical, trachea midline and thyroid not enlarged, symmetric, no tenderness/mass/nodules Lungs: clear to auscultation bilaterally Heart: regular rate and rhythm Abdomen: soft, non-tender; bowel sounds normal; no masses,  no organomegaly Extremities: no edema, redness or tenderness in the calves or thighs Pulses: 2+ and symmetric; R radial cath site stable Skin: Skin color, texture, turgor normal. No rashes or lesions Neurologic: Grossly normal   Rate: 52  Rhythm: sinus bradycardia  ECG (independently read by me): SB at 52; inferior Q waves with T wave infersion  Lab Results: BMP Latest Ref Rng 06/06/2015  Glucose 65 - 99 mg/dL 155(H)  BUN 6 - 20 mg/dL 12  Creatinine 0.61 - 1.24  mg/dL 0.74  Sodium 135 - 145 mmol/L 135  Potassium 3.5 - 5.1 mmol/L 3.7  Chloride 101 - 111 mmol/L 99(L)  CO2 22 - 32 mmol/L 24  Calcium 8.9 - 10.3 mg/dL 9.1     Hepatic Function Latest Ref Rng 06/06/2015  Total Protein 6.5 - 8.1 g/dL 8.3(H)  Albumin 3.5 - 5.0 g/dL 4.7  AST 15 - 41 U/L 28  ALT 17 - 63 U/L 21  Alk Phosphatase 38 - 126 U/L 72  Total Bilirubin 0.3 - 1.2 mg/dL 0.6     Recent Labs  06/06/15 1808 06/07/15 0758  WBC 8.1 6.0  HGB 14.9 12.2*  HCT 41.9 34.8*  MCV 93.3 94.1  PLT 222 183   No results found for: TSH  Recent Labs  06/06/15 1808 06/06/15 2050 06/07/15 0104  TROPONINI 0.13* 2.10* 5.47*    No results found for: TSH No results for input(s): HGBA1C in the last 72 hours.   Recent Labs  06/06/15 1808  INR 1.07   BNP (last 3 results) No results for input(s): BNP in the last 8760 hours.  ProBNP (last 3 results) No results for input(s): PROBNP in the last 8760 hours.   Lipid Panel  No results found for: CHOL, TRIG, HDL, CHOLHDL, VLDL, LDLCALC,  LDLDIRECT    Imaging:  Dg Chest Portable 1 View  06/06/2015   CLINICAL DATA:  Chest pain. Central chest pressure for 1 week. Pressure became pain today. Initial encounter. Intermittent shortness of breath. Nausea.  EXAM: PORTABLE CHEST - 1 VIEW  COMPARISON:  None.  FINDINGS: Cardiopericardial silhouette within normal limits. Mediastinal contours normal. Trachea midline. No airspace disease or effusion. Monitoring leads project over the chest. Lower cervical ACDF.  IMPRESSION: No active disease.   Electronically Signed   By: Dereck Ligas M.D.   On: 06/06/2015 18:16   8/9-07/2015 EMERGENT CARDIAC CATHETERIZATION Conclusion     Mid RCA lesion, 99% stenosed with heavy thrombus. The lesion was not previously treated.  Moderate disease in the proximal LAD.  Continue IV IIb/IIIa inhibitors for 48 hours. Recath on Friday to llok for thrombus resolution in the RCA, with subsequent decreased chance of  embolization during PCI. Continue aggressive secondary prevention and antianginal therapy until that time. Lovenox for now until the time of the repeat cath.      Assessment/Plan:   Principal Problem:   NSTEMI (non-ST elevated myocardial infarction) Active Problems:   Elevated troponin   Hypertriglyceridemia  1. Day 1 s/p Inferior NSTEMI with 99% RCA stenosis with heavy thrombus burden.  In retrospect, he had a 1 week h/o exertional chest tightness prior to presenting to [facility] last evening. Troponin increased to 15.59 this am. Now on lovenox 90 s1 q 12 hrs, and continue aggrastat. Tentatively plan PCI on Friday, but sooner if recurrent symptoms. 2. Hypotension at 96/60 presently:   Probably contributed by potential RV involvement and osmotic diuresis from contrast. IV NTG being weaned.  Will resume fluids at 75 cc/hr, and once BP > 100 can initiate low dose oral nitrates as BP allows. 3. Sinus Bradycardia; on low dose metoprolol 12.5 mg bid. 4. HLP:  Now on atorvastatin 80 mg; lipids not yet available.   Troy Sine, MD, [facility] 06/07/2015, 8:27 AM

## 2015-06-07 NOTE — Plan of Care (Signed)
Continued follow up with Sean Paul reveals some mild but ongoing chest discomfort despite NTG at 40 mcg and tirofiban.  ECG also shows evidence of evolving inferior infarct.    Plan: LHC for cors and possible PCI D/w Dr. Irish Lack.

## 2015-06-07 NOTE — Care Management Note (Signed)
Case Management Note  Patient Details  Name: DENNARD VEZINA MRN: [id number] Date of Birth: [birth year]  Subjective/Objective:      59 y.o. M admitted with NSTEMI who is s/p Cardiac Cath. Lives alone in [place].             Action/Plan:Will follow for Discharge Needs   Expected Discharge Date:                  Expected Discharge Plan:     In-House Referral:     Discharge planning Services  CM Consult  Post Acute Care Choice:    Choice offered to:     DME Arranged:    DME Agency:     HH Arranged:    HH Agency:     Status of Service:  In process, will continue to follow  Medicare Important Message Given:    Date Medicare IM Given:    Medicare IM give by:    Date Additional Medicare IM Given:    Additional Medicare Important Message give by:     If discussed at [place] of Stay Meetings, dates discussed:    Additional Comments:  Delrae Sawyers, RN 06/07/2015, 11:59 AM

## 2015-06-07 NOTE — Progress Notes (Signed)
Patient arrived to 2W and nitro drip initiated at 20 mcg/min, patient also started on Aggrastat drip at 15.5 ml, Aggrastat bolus also administered, patient still with complaints of chest pain and pressure, Dr. Philbert Riser notified and Nitro drip adjusted from 20 to 40 mcg/min, patient still with complaints at chest pain and pressure after drip adjusted, VSS, troponin drawn and sent, Dr. Philbert Riser to floor to assess and when on floor critical troponin of 5.47 called in and taken by Dr. Philbert Riser, patient to go to cath lab at this time, currently being prepped, patient resting comfortably, will continue to monitor closely.

## 2015-06-08 DIAGNOSIS — I214 Non-ST elevation (NSTEMI) myocardial infarction: Principal | ICD-10-CM

## 2015-06-08 DIAGNOSIS — R7989 Other specified abnormal findings of blood chemistry: Secondary | ICD-10-CM

## 2015-06-08 LAB — CBC
HCT: 35.4 % — ABNORMAL LOW (ref 39.0–52.0)
HEMOGLOBIN: 12.3 g/dL — AB (ref 13.0–17.0)
MCH: 32.8 pg (ref 26.0–34.0)
MCHC: 34.7 g/dL (ref 30.0–36.0)
MCV: 94.4 fL (ref 78.0–100.0)
PLATELETS: 193 10*3/uL (ref 150–400)
RBC: 3.75 MIL/uL — AB (ref 4.22–5.81)
RDW: 11.9 % (ref 11.5–15.5)
WBC: 6.4 10*3/uL (ref 4.0–10.5)

## 2015-06-08 LAB — BASIC METABOLIC PANEL
ANION GAP: 8 (ref 5–15)
BUN: 6 mg/dL (ref 6–20)
CALCIUM: 8.5 mg/dL — AB (ref 8.9–10.3)
CHLORIDE: 106 mmol/L (ref 101–111)
CO2: 24 mmol/L (ref 22–32)
CREATININE: 0.73 mg/dL (ref 0.61–1.24)
Glucose, Bld: 100 mg/dL — ABNORMAL HIGH (ref 65–99)
Potassium: 3.7 mmol/L (ref 3.5–5.1)
Sodium: 138 mmol/L (ref 135–145)

## 2015-06-08 LAB — LIPID PANEL
CHOL/HDL RATIO: 3.7 ratio
CHOLESTEROL: 111 mg/dL (ref 0–200)
HDL: 30 mg/dL — ABNORMAL LOW (ref >40)
LDL CALC: 66 mg/dL (ref 0–99)
Triglycerides: 75 mg/dL (ref <150)
VLDL: 15 mg/dL (ref 0–40)

## 2015-06-08 LAB — HEMOGLOBIN A1C
HEMOGLOBIN A1C: 5.4 % (ref 4.8–5.6)
MEAN PLASMA GLUCOSE: 108 mg/dL

## 2015-06-08 MED ORDER — SODIUM CHLORIDE 0.9 % IJ SOLN
3.0000 mL | Freq: Two times a day (BID) | INTRAMUSCULAR | Status: DC
  Administered 2015-06-08 – 2015-06-09 (×2): 3 mL via INTRAVENOUS

## 2015-06-08 MED ORDER — SODIUM CHLORIDE 0.9 % IV SOLN: 250.0000 mL | INTRAVENOUS | Status: DC | PRN

## 2015-06-08 MED ORDER — METOPROLOL TARTRATE 25 MG PO TABS
25.0000 mg | ORAL_TABLET | Freq: Two times a day (BID) | ORAL | Status: DC
  Administered 2015-06-08 – 2015-06-10 (×5): 25 mg via ORAL
  Filled 2015-06-08 (×3): qty 1
  Filled 2015-06-08: qty 2
  Filled 2015-06-08: qty 1

## 2015-06-08 MED ORDER — ISOSORBIDE MONONITRATE ER 30 MG PO TB24
30.0000 mg | ORAL_TABLET | Freq: Every day | ORAL | Status: DC
  Administered 2015-06-08 – 2015-06-10 (×3): 30 mg via ORAL
  Filled 2015-06-08 (×3): qty 1

## 2015-06-08 MED ORDER — SODIUM CHLORIDE 0.9 % WEIGHT BASED INFUSION
1.0000 mL/kg/h | INTRAVENOUS | Status: DC
  Administered 2015-06-09 (×2): 1 mL/kg/h via INTRAVENOUS

## 2015-06-08 MED ORDER — ASPIRIN 81 MG PO CHEW
81.0000 mg | CHEWABLE_TABLET | ORAL | Status: AC
  Administered 2015-06-09: 81 mg via ORAL
  Filled 2015-06-08: qty 1

## 2015-06-08 MED ORDER — SODIUM CHLORIDE 0.9 % WEIGHT BASED INFUSION: 3.0000 mL/kg/h | INTRAVENOUS | Status: DC

## 2015-06-08 MED ORDER — ENOXAPARIN SODIUM 80 MG/0.8ML ~~LOC~~ SOLN
80.0000 mg | Freq: Two times a day (BID) | SUBCUTANEOUS | Status: AC
  Administered 2015-06-08: 80 mg via SUBCUTANEOUS

## 2015-06-08 MED ORDER — SODIUM CHLORIDE 0.9 % IJ SOLN: 3.0000 mL | INTRAMUSCULAR | Status: DC | PRN

## 2015-06-08 NOTE — Progress Notes (Signed)
Subjective:  Day 2 s/p NSTEMI; no chest pain or shortness of breath.    Objective:   Vital Signs : Filed Vitals:   06/08/15 0500 06/08/15 0600 06/08/15 0700 06/08/15 0734  BP: 122/60 113/73 133/78 124/66  Pulse:    71  Temp:    97.9 F (36.6 C)  TempSrc:    Oral  Resp: _0 Height:      Weight:      SpO2:    94%    Intake/Output from previous day:  Intake/Output Summary (Last 24 hours) at 06/08/15 0813 Last data filed at 06/08/15 0700  Gross per 24 hour  Intake 2870.5 ml  Output   2630 ml  Net  240.5 ml    I/O since admission: +367  Wt Readings from Last 3 Encounters:  06/07/15 175 lb 14.8 oz (79.8 kg)  06/17/11 179 lb (81.194 kg)    Medications: . aspirin  81 mg Oral Daily  . aspirin EC  81 mg Oral Daily  . atorvastatin  80 mg Oral q1800  . enoxaparin (LOVENOX) injection  90 mg Subcutaneous Q12H  . isosorbide mononitrate  15 mg Oral Daily  . metoprolol tartrate  12.5 mg Oral BID  . sodium chloride  3 mL Intravenous Q12H    . sodium chloride 1,000 mL (06/06/15 1802)  . sodium chloride 75 mL/hr at 06/08/15 0700  . nitroGLYCERIN 10 mcg/min (06/08/15 0700)  . tirofiban 0.15 mcg/kg/min (06/08/15 0700)    Physical Exam:   General appearance: alert, cooperative and no distress Neck: no adenopathy, no carotid bruit, no JVD, supple, symmetrical, trachea midline and thyroid not enlarged, symmetric, no tenderness/mass/nodules Lungs: clear to auscultation bilaterally Heart: regular rate and rhythm Abdomen: soft, non-tender; bowel sounds normal; no masses,  no organomegaly Extremities: no edema, redness or tenderness in the calves or thighs Pulses: 2+ and symmetric; R radial cath site stable Skin: Skin color, texture, turgor normal. No rashes or lesions Neurologic: Grossly normal   Rate: 66  Rhythm:NSR  06/08/2015 ECG (independently read by me): NSR at 65, inferior Q waves with T inversion 2,3,aVF  06/07/2015 ECG (independently read by me): SB at 52;  inferior Q waves with T wave infersion  Lab Results: BMP Latest Ref Rng 06/08/2015 06/07/2015 06/06/2015  Glucose 65 - 99 mg/dL 100(H) 105(H) 155(H)  BUN 6 - 20 mg/dL _1 Creatinine 0.61 - 1.24 mg/dL 0.73 0.64 0.74  Sodium 135 - 145 mmol/L 138 135 135  Potassium 3.5 - 5.1 mmol/L 3.7 3.3(L) 3.7  Chloride 101 - 111 mmol/L 106 105 99(L)  CO2 22 - 32 mmol/L _2 Calcium 8.9 - 10.3 mg/dL 8.5(L) 8.4(L) 9.1     Hepatic Function Latest Ref Rng 06/06/2015  Total Protein 6.5 - 8.1 g/dL 8.3(H)  Albumin 3.5 - 5.0 g/dL 4.7  AST 15 - 41 U/L 28  ALT 17 - 63 U/L 21  Alk Phosphatase 38 - 126 U/L 72  Total Bilirubin 0.3 - 1.2 mg/dL 0.6     Recent Labs  06/06/15 1808 06/07/15 0758 06/08/15 0326  WBC 8.1 6.0 6.4  HGB 14.9 12.2* 12.3*  HCT 41.9 34.8* 35.4*  MCV 93.3 94.1 94.4  PLT 222 183 193   Lab Results  Component Value Date   TSH 1.076 06/07/2015    Recent Labs  06/07/15 0104 06/07/15 0758 06/07/15 1155  TROPONINI 5.47* 15.59* 22.86*    Lab Results  Component Value Date   TSH 1.076  06/07/2015    Recent Labs  06/07/15 0758  HGBA1C 5.4     Recent Labs  06/06/15 1808  INR 1.07   BNP (last 3 results) No results for input(s): BNP in the last 8760 hours.  ProBNP (last 3 results) No results for input(s): PROBNP in the last 8760 hours.   Lipid Panel     Component Value Date/Time   CHOL 111 06/08/2015 0326   TRIG 75 06/08/2015 0326   HDL 30* 06/08/2015 0326   CHOLHDL 3.7 06/08/2015 0326   VLDL 15 06/08/2015 0326   LDLCALC 66 06/08/2015 0326      Imaging:  Dg Chest Portable 1 View  06/06/2015   CLINICAL DATA:  Chest pain. Central chest pressure for 1 week. Pressure became pain today. Initial encounter. Intermittent shortness of breath. Nausea.  EXAM: PORTABLE CHEST - 1 VIEW  COMPARISON:  None.  FINDINGS: Cardiopericardial silhouette within normal limits. Mediastinal contours normal. Trachea midline. No airspace disease or effusion. Monitoring leads  project over the chest. Lower cervical ACDF.  IMPRESSION: No active disease.   Electronically Signed   By: Dereck Ligas M.D.   On: 06/06/2015 18:16   8/9-07/2015 EMERGENT CARDIAC CATHETERIZATION Conclusion     Mid RCA lesion, 99% stenosed with heavy thrombus. The lesion was not previously treated.  Moderate disease in the proximal LAD.  Continue IV IIb/IIIa inhibitors for 48 hours. Recath on Friday to llok for thrombus resolution in the RCA, with subsequent decreased chance of embolization during PCI. Continue aggressive secondary prevention and antianginal therapy until that time. Lovenox for now until the time of the repeat cath.      Assessment/Plan:   Principal Problem:   NSTEMI (non-ST elevated myocardial infarction) Active Problems:   Elevated troponin   Hypertriglyceridemia  1. Day 2 s/p Inferior NSTEMI with 99% RCA stenosis with heavy thrombus burden.  In retrospect, he had a 1 week h/o exertional chest tightness prior to presenting to [facility] last evening. Troponin increased to 22.86. On lovenox 90 s1 q 12 hrs, and  aggrastat. Tentatively plan PCI on Friday, but sooner if recurrent symptoms. 2. Hypotension:   Resolved.  Initially  contributed by potential RV involvement and osmotic diuresis from contrast. IV NTG being weaned.  Will continue with hydration but decrease to 50 cc/hr today; increase imdur to 30 mg and dc iv NTG 3. Sinus Bradycardia improved; P now in the 60's;  Will titrate metoprolol to 25 mg bid 4. HLP:  Now on atorvastatin 80 mg; LDL 66  Discussed PCI procedure with patient. As per Dr. Irish Lack, plan tomorrow.   Troy Sine, MD, [facility] 06/08/2015, 8:13 AM

## 2015-06-09 ENCOUNTER — Encounter (HOSPITAL_COMMUNITY)
Admission: EM | Disposition: A | Discharge status: Home / Self Care | Payer: Self-pay | Source: Home / Self Care | Attending: Interventional Cardiology

## 2015-06-09 ENCOUNTER — Encounter (HOSPITAL_COMMUNITY): Payer: Self-pay | Admitting: Interventional Cardiology

## 2015-06-09 HISTORY — PX: CARDIAC CATHETERIZATION: SHX172

## 2015-06-09 HISTORY — PX: PERIPHERAL VASCULAR CATHETERIZATION: SHX172C

## 2015-06-09 LAB — POCT ACTIVATED CLOTTING TIME: ACTIVATED CLOTTING TIME: 337 s

## 2015-06-09 SURGERY — LEFT HEART CATH AND CORONARY ANGIOGRAPHY
Anesthesia: LOCAL

## 2015-06-09 MED ORDER — TICAGRELOR 90 MG PO TABS
ORAL_TABLET | ORAL | Status: AC
  Filled 2015-06-09: qty 1

## 2015-06-09 MED ORDER — ANGIOPLASTY BOOK
Freq: Once | Status: AC
  Administered 2015-06-09: 21:00:00
  Filled 2015-06-09: qty 1

## 2015-06-09 MED ORDER — MIDAZOLAM HCL 2 MG/2ML IJ SOLN
INTRAMUSCULAR | Status: AC
  Filled 2015-06-09: qty 4

## 2015-06-09 MED ORDER — ASPIRIN 81 MG PO CHEW: 81.0000 mg | CHEWABLE_TABLET | Freq: Every day | ORAL | Status: DC

## 2015-06-09 MED ORDER — BIVALIRUDIN 250 MG IV SOLR
INTRAVENOUS | Status: AC
  Filled 2015-06-09: qty 250

## 2015-06-09 MED ORDER — MIDAZOLAM HCL 2 MG/2ML IJ SOLN
INTRAMUSCULAR | Status: DC | PRN
  Administered 2015-06-09: 2 mg via INTRAVENOUS

## 2015-06-09 MED ORDER — FENTANYL CITRATE (PF) 100 MCG/2ML IJ SOLN
INTRAMUSCULAR | Status: AC
  Filled 2015-06-09: qty 4

## 2015-06-09 MED ORDER — SODIUM CHLORIDE 0.9 % WEIGHT BASED INFUSION
1.0000 mL/kg/h | INTRAVENOUS | Status: AC
Start: 2015-06-09 — End: 2015-06-09

## 2015-06-09 MED ORDER — SODIUM CHLORIDE 0.9 % IV SOLN: 250.0000 mL | INTRAVENOUS | Status: DC | PRN

## 2015-06-09 MED ORDER — SODIUM CHLORIDE 0.9 % IJ SOLN
3.0000 mL | Freq: Two times a day (BID) | INTRAMUSCULAR | Status: DC
  Administered 2015-06-09: 3 mL via INTRAVENOUS

## 2015-06-09 MED ORDER — HEPARIN (PORCINE) IN NACL 2-0.9 UNIT/ML-% IJ SOLN
INTRAMUSCULAR | Status: AC
  Filled 2015-06-09: qty 1000

## 2015-06-09 MED ORDER — ADENOSINE 6 MG/2ML IV SOLN
INTRAVENOUS | Status: AC
  Filled 2015-06-09: qty 2

## 2015-06-09 MED ORDER — IOHEXOL 350 MG/ML SOLN
INTRAVENOUS | Status: DC | PRN
  Administered 2015-06-09: 85 mL via INTRA_ARTERIAL

## 2015-06-09 MED ORDER — TICAGRELOR 90 MG PO TABS
ORAL_TABLET | ORAL | Status: DC | PRN
  Administered 2015-06-09: 180 mg via ORAL

## 2015-06-09 MED ORDER — ACETAMINOPHEN 325 MG PO TABS: 650.0000 mg | ORAL_TABLET | ORAL | Status: DC | PRN

## 2015-06-09 MED ORDER — LIDOCAINE HCL (PF) 1 % IJ SOLN
INTRAMUSCULAR | Status: AC
  Filled 2015-06-09: qty 30

## 2015-06-09 MED ORDER — TICAGRELOR 90 MG PO TABS
90.0000 mg | ORAL_TABLET | Freq: Two times a day (BID) | ORAL | Status: DC
  Administered 2015-06-09 – 2015-06-10 (×2): 90 mg via ORAL
  Filled 2015-06-09 (×2): qty 1

## 2015-06-09 MED ORDER — ONDANSETRON HCL 4 MG/2ML IJ SOLN: 4.0000 mg | Freq: Four times a day (QID) | INTRAMUSCULAR | Status: DC | PRN

## 2015-06-09 MED ORDER — NITROGLYCERIN 1 MG/10 ML FOR IR/CATH LAB
INTRA_ARTERIAL | Status: DC | PRN
  Administered 2015-06-09: 14:00:00

## 2015-06-09 MED ORDER — BIVALIRUDIN BOLUS VIA INFUSION - CUPID
INTRAVENOUS | Status: DC | PRN
Start: 2015-06-09 — End: 2015-06-09
  Administered 2015-06-09: 60.6 mg via INTRAVENOUS

## 2015-06-09 MED ORDER — FENTANYL CITRATE (PF) 100 MCG/2ML IJ SOLN
INTRAMUSCULAR | Status: DC | PRN
  Administered 2015-06-09: 25 ug via INTRAVENOUS

## 2015-06-09 MED ORDER — ADENOSINE (DIAGNOSTIC) FOR INTRACORONARY USE
INTRAVENOUS | Status: DC | PRN
Start: 2015-06-09 — End: 2015-06-09
  Administered 2015-06-09: 60 ug via INTRACORONARY

## 2015-06-09 MED ORDER — NITROGLYCERIN 1 MG/10 ML FOR IR/CATH LAB
INTRA_ARTERIAL | Status: AC
  Filled 2015-06-09: qty 10

## 2015-06-09 MED ORDER — SODIUM CHLORIDE 0.9 % IJ SOLN: 3.0000 mL | INTRAMUSCULAR | Status: DC | PRN

## 2015-06-09 MED ORDER — SODIUM CHLORIDE 0.9 % IV SOLN
250.0000 mg | INTRAVENOUS | Status: DC | PRN
  Administered 2015-06-09: 1.75 mg/kg/h via INTRAVENOUS

## 2015-06-09 MED ORDER — TIROFIBAN HCL IV 5 MG/100ML: INTRAVENOUS | Status: DC | PRN

## 2015-06-09 SURGICAL SUPPLY — 22 items
BALLN EMERGE MR 2.5X12 (BALLOONS) ×3
BALLN ~~LOC~~ EMERGE MR 3.5X12 (BALLOONS) ×3
BALLOON EMERGE MR 2.5X12 (BALLOONS) IMPLANT
BALLOON ~~LOC~~ EMERGE MR 3.5X12 (BALLOONS) IMPLANT
CATH EXTRAC PRONTO 5.5F 138CM (CATHETERS) ×1 IMPLANT
CATH INFINITI 5FR ANG PIGTAIL (CATHETERS) ×1 IMPLANT
CATH INFINITI JR4 5F (CATHETERS) ×3 IMPLANT
DEVICE WIRE ANGIOSEAL 6FR (Vascular Products) ×1 IMPLANT
GUIDE CATH RUNWAY 6FR FR4 (CATHETERS) ×1 IMPLANT
KIT ENCORE 26 ADVANTAGE (KITS) ×1 IMPLANT
KIT HEART LEFT (KITS) ×3 IMPLANT
PACK CARDIAC CATHETERIZATION (CUSTOM PROCEDURE TRAY) ×3 IMPLANT
SHEATH PINNACLE 5F 10CM (SHEATH) ×1 IMPLANT
SHEATH PINNACLE 6F 10CM (SHEATH) ×1 IMPLANT
STENT SYNERGY DES 3X20 (Permanent Stent) ×1 IMPLANT
SYR MEDRAD MARK V 150ML (SYRINGE) ×1 IMPLANT
TRANSDUCER W/STOPCOCK (MISCELLANEOUS) ×3 IMPLANT
TUBING CIL FLEX 10 FLL-RA (TUBING) ×3 IMPLANT
TUBING CONTRAST HIGH PRESS 20 (MISCELLANEOUS) ×1 IMPLANT
VALVE GUARDIAN II ~~LOC~~ HEMO (MISCELLANEOUS) ×1 IMPLANT
WIRE ASAHI PROWATER 180CM (WIRE) ×1 IMPLANT
WIRE EMERALD 3MM-J .035X150CM (WIRE) ×1 IMPLANT

## 2015-06-09 NOTE — H&P (View-Only) (Signed)
Subjective:  Day 3 s/p NSTEMI; no chest pain or shortness of breath.    Objective:   Vital Signs : Filed Vitals:   06/09/15 0400 06/09/15 0600 06/09/15 0700 06/09/15 0800  BP: 122/56 131/64 136/75 126/66  Pulse:      Temp:   97.8 F (36.6 C)   TempSrc:   Oral   Resp: 10 0 14 10  Height:      Weight:      SpO2:   99%     Intake/Output from previous day:  Intake/Output Summary (Last 24 hours) at 06/09/15 1035 Last data filed at 06/09/15 0902  Gross per 24 hour  Intake 2296.5 ml  Output   1250 ml  Net 1046.5 ml    I/O since admission: +1904  Wt Readings from Last 3 Encounters:  06/09/15 178 lb 2.1 oz (80.8 kg)  06/17/11 179 lb (81.194 kg)    Medications: . aspirin EC  81 mg Oral Daily  . atorvastatin  80 mg Oral q1800  . isosorbide mononitrate  30 mg Oral Daily  . metoprolol tartrate  25 mg Oral BID  . sodium chloride  3 mL Intravenous Q12H  . sodium chloride  3 mL Intravenous Q12H    . sodium chloride 1,000 mL (06/06/15 1802)  . sodium chloride Stopped (06/09/15 0600)  . sodium chloride 1 mL/kg/hr (06/09/15 0659)  . tirofiban 0.15 mcg/kg/min (06/09/15 0800)    Physical Exam:   General appearance: alert, cooperative and no distress Neck: no adenopathy, no carotid bruit, no JVD, supple, symmetrical, trachea midline and thyroid not enlarged, symmetric, no tenderness/mass/nodules Lungs: clear to auscultation bilaterally Heart: regular rate and rhythm Abdomen: soft, non-tender; bowel sounds normal; no masses,  no organomegaly Extremities: no edema, redness or tenderness in the calves or thighs Pulses: 2+ and symmetric; R radial cath site stable Skin: Skin color, texture, turgor normal. No rashes or lesions Neurologic: Grossly normal   Rate: 61  Rhythm:NSR  06/08/2015 ECG (independently read by me): NSR at 65, inferior Q waves with T inversion 2,3,aVF  06/07/2015 ECG (independently read by me): SB at 52; inferior Q waves with T wave infersion  Lab  Results: BMP Latest Ref Rng 06/08/2015 06/07/2015 06/06/2015  Glucose 65 - 99 mg/dL 100(H) 105(H) 155(H)  BUN 6 - 20 mg/dL _0 Creatinine 0.61 - 1.24 mg/dL 0.73 0.64 0.74  Sodium 135 - 145 mmol/L 138 135 135  Potassium 3.5 - 5.1 mmol/L 3.7 3.3(L) 3.7  Chloride 101 - 111 mmol/L 106 105 99(L)  CO2 22 - 32 mmol/L _1 Calcium 8.9 - 10.3 mg/dL 8.5(L) 8.4(L) 9.1     Hepatic Function Latest Ref Rng 06/06/2015  Total Protein 6.5 - 8.1 g/dL 8.3(H)  Albumin 3.5 - 5.0 g/dL 4.7  AST 15 - 41 U/L 28  ALT 17 - 63 U/L 21  Alk Phosphatase 38 - 126 U/L 72  Total Bilirubin 0.3 - 1.2 mg/dL 0.6     Recent Labs  06/06/15 1808 06/07/15 0758 06/08/15 0326  WBC 8.1 6.0 6.4  HGB 14.9 12.2* 12.3*  HCT 41.9 34.8* 35.4*  MCV 93.3 94.1 94.4  PLT 222 183 193   Lab Results  Component Value Date   TSH 1.076 06/07/2015    Recent Labs  06/07/15 0104 06/07/15 0758 06/07/15 1155  TROPONINI 5.47* 15.59* 22.86*    Lab Results  Component Value Date   TSH 1.076 06/07/2015    Recent Labs  06/07/15 0758  HGBA1C  5.4     Recent Labs  06/06/15 1808  INR 1.07   BNP (last 3 results) No results for input(s): BNP in the last 8760 hours.  ProBNP (last 3 results) No results for input(s): PROBNP in the last 8760 hours.   Lipid Panel     Component Value Date/Time   CHOL 111 06/08/2015 0326   TRIG 75 06/08/2015 0326   HDL 30* 06/08/2015 0326   CHOLHDL 3.7 06/08/2015 0326   VLDL 15 06/08/2015 0326   LDLCALC 66 06/08/2015 0326      Imaging:  No results found. 8/9-07/2015 EMERGENT CARDIAC CATHETERIZATION Conclusion     Mid RCA lesion, 99% stenosed with heavy thrombus. The lesion was not previously treated.  Moderate disease in the proximal LAD.  Continue IV IIb/IIIa inhibitors for 48 hours. Recath on Friday to llok for thrombus resolution in the RCA, with subsequent decreased chance of embolization during PCI. Continue aggressive secondary prevention and antianginal therapy  until that time. Lovenox for now until the time of the repeat cath.      Assessment/Plan:   Principal Problem:   NSTEMI (non-ST elevated myocardial infarction) Active Problems:   Elevated troponin   Hypertriglyceridemia  1. Day 3 s/p Inferior NSTEMI with 99% RCA stenosis with heavy thrombus burden.  In retrospect, he had a 1 week h/o exertional chest tightness prior to presenting to [facility] last evening. Troponin increased to 22.86. On lovenox 90 s1 q 12 hrs, and  aggrastat. Tentatively plan PCI on Friday, but sooner if recurrent symptoms. 2. Hypotension:   Resolved.  Initially  contributed by potential RV involvement and osmotic diuresis from contrast. IV NTG being weaned.  Will continue with hydration but decrease to 50 cc/hr today; increase imdur to 30 mg and dc iv NTG 3. Sinus Bradycardia improved; P now in the 60's;  Will titrate metoprolol to 25 mg bid 4. HLP:  Now on atorvastatin 80 mg; LDL 66  Discussed cath with probable PCI procedure with patient. If significant residual thrombus ? Angiojet; need to re-assess LAD/DX in ultimate plan of management. The risks and benefits of a cardiac catheterization including, but not limited to, death, stroke, MI, kidney damage and bleeding were discussed with the patient who indicates understanding and agrees to proceed.    Troy Sine, MD, [facility] 06/09/2015, 10:35 AM

## 2015-06-09 NOTE — Progress Notes (Signed)
 Subjective:  Day 3 s/p NSTEMI; no chest pain or shortness of breath.    Objective:   Vital Signs : Filed Vitals:   06/09/15 0400 06/09/15 0600 06/09/15 0700 06/09/15 0800  BP: 122/56 131/64 136/75 126/66  Pulse:      Temp:   97.8 F (36.6 C)   TempSrc:   Oral   Resp: 10 0 14 10  Height:      Weight:      SpO2:   99%     Intake/Output from previous day:  Intake/Output Summary (Last 24 hours) at 06/09/15 1035 Last data filed at 06/09/15 0902  Gross per 24 hour  Intake 2296.5 ml  Output   1250 ml  Net 1046.5 ml    I/O since admission: +1904  Wt Readings from Last 3 Encounters:  06/09/15 178 lb 2.1 oz (80.8 kg)  06/17/11 179 lb (81.194 kg)    Medications: . aspirin EC  81 mg Oral Daily  . atorvastatin  80 mg Oral q1800  . isosorbide mononitrate  30 mg Oral Daily  . metoprolol tartrate  25 mg Oral BID  . sodium chloride  3 mL Intravenous Q12H  . sodium chloride  3 mL Intravenous Q12H    . sodium chloride 1,000 mL (06/06/15 1802)  . sodium chloride Stopped (06/09/15 0600)  . sodium chloride 1 mL/kg/hr (06/09/15 0659)  . tirofiban 0.15 mcg/kg/min (06/09/15 0800)    Physical Exam:   General appearance: alert, cooperative and no distress Neck: no adenopathy, no carotid bruit, no JVD, supple, symmetrical, trachea midline and thyroid not enlarged, symmetric, no tenderness/mass/nodules Lungs: clear to auscultation bilaterally Heart: regular rate and rhythm Abdomen: soft, non-tender; bowel sounds normal; no masses,  no organomegaly Extremities: no edema, redness or tenderness in the calves or thighs Pulses: 2+ and symmetric; R radial cath site stable Skin: Skin color, texture, turgor normal. No rashes or lesions Neurologic: Grossly normal   Rate: 61  Rhythm:NSR  06/08/2015 ECG (independently read by me): NSR at 65, inferior Q waves with T inversion 2,3,aVF  06/07/2015 ECG (independently read by me): SB at 52; inferior Q waves with T wave infersion  Lab  Results: BMP Latest Ref Rng 06/08/2015 06/07/2015 06/06/2015  Glucose 65 - 99 mg/dL 100(H) 105(H) 155(H)  BUN 6 - 20 mg/dL 6 7 12  Creatinine 0.61 - 1.24 mg/dL 0.73 0.64 0.74  Sodium 135 - 145 mmol/L 138 135 135  Potassium 3.5 - 5.1 mmol/L 3.7 3.3(L) 3.7  Chloride 101 - 111 mmol/L 106 105 99(L)  CO2 22 - 32 mmol/L 24 22 24  Calcium 8.9 - 10.3 mg/dL 8.5(L) 8.4(L) 9.1     Hepatic Function Latest Ref Rng 06/06/2015  Total Protein 6.5 - 8.1 g/dL 8.3(H)  Albumin 3.5 - 5.0 g/dL 4.7  AST 15 - 41 U/L 28  ALT 17 - 63 U/L 21  Alk Phosphatase 38 - 126 U/L 72  Total Bilirubin 0.3 - 1.2 mg/dL 0.6     Recent Labs  06/06/15 1808 06/07/15 0758 06/08/15 0326  WBC 8.1 6.0 6.4  HGB 14.9 12.2* 12.3*  HCT 41.9 34.8* 35.4*  MCV 93.3 94.1 94.4  PLT 222 183 193   Lab Results  Component Value Date   TSH 1.076 06/07/2015    Recent Labs  06/07/15 0104 06/07/15 0758 06/07/15 1155  TROPONINI 5.47* 15.59* 22.86*    Lab Results  Component Value Date   TSH 1.076 06/07/2015    Recent Labs  06/07/15 0758  HGBA1C   5.4     Recent Labs  06/06/15 1808  INR 1.07   BNP (last 3 results) No results for input(s): BNP in the last 8760 hours.  ProBNP (last 3 results) No results for input(s): PROBNP in the last 8760 hours.   Lipid Panel     Component Value Date/Time   CHOL 111 06/08/2015 0326   TRIG 75 06/08/2015 0326   HDL 30* 06/08/2015 0326   CHOLHDL 3.7 06/08/2015 0326   VLDL 15 06/08/2015 0326   LDLCALC 66 06/08/2015 0326      Imaging:  No results found. 8/9-07/2015 EMERGENT CARDIAC CATHETERIZATION Conclusion     Mid RCA lesion, 99% stenosed with heavy thrombus. The lesion was not previously treated.  Moderate disease in the proximal LAD.  Continue IV IIb/IIIa inhibitors for 48 hours. Recath on Friday to llok for thrombus resolution in the RCA, with subsequent decreased chance of embolization during PCI. Continue aggressive secondary prevention and antianginal therapy  until that time. Lovenox for now until the time of the repeat cath.      Assessment/Plan:   Principal Problem:   NSTEMI (non-ST elevated myocardial infarction) Active Problems:   Elevated troponin   Hypertriglyceridemia  1. Day 3 s/p Inferior NSTEMI with 99% RCA stenosis with heavy thrombus burden.  In retrospect, he had a 1 week h/o exertional chest tightness prior to presenting to [facility] last evening. Troponin increased to 22.86. On lovenox 90 s1 q 12 hrs, and  aggrastat. Tentatively plan PCI on Friday, but sooner if recurrent symptoms. 2. Hypotension:   Resolved.  Initially  contributed by potential RV involvement and osmotic diuresis from contrast. IV NTG being weaned.  Will continue with hydration but decrease to 50 cc/hr today; increase imdur to 30 mg and dc iv NTG 3. Sinus Bradycardia improved; P now in the 60's;  Will titrate metoprolol to 25 mg bid 4. HLP:  Now on atorvastatin 80 mg; LDL 66  Discussed cath with probable PCI procedure with patient. If significant residual thrombus ? Angiojet; need to re-assess LAD/DX in ultimate plan of management. The risks and benefits of a cardiac catheterization including, but not limited to, death, stroke, MI, kidney damage and bleeding were discussed with the patient who indicates understanding and agrees to proceed.    Troy Sine, MD, [facility] 06/09/2015, 10:35 AM

## 2015-06-09 NOTE — Interval H&P Note (Signed)
Cath Lab Visit (complete for each Cath Lab visit)  Clinical Evaluation Leading to the Procedure:   ACS: Yes.    Non-ACS:    Anginal Classification: CCS IV  Anti-ischemic medical therapy: Minimal Therapy (1 class of medications)  Non-Invasive Test Results: No non-invasive testing performed  Prior CABG: No previous CABG   TIMI Score  Patient Information:  TIMI Score is 5  Revascularization of the presumed culprit artery  A (9)  Indication: 11; Score: 9 TIMI Score  Patient Information:  TIMI Score is 5  Revascularization of multiple coronary arteries when the culprit artery cannot clearly be determined  A (9)  Indication: 12; Score: 9    History and Physical Interval Note:  06/09/2015 1:04 PM  Sean Paul  has presented today for surgery, with the diagnosis of CP  The various methods of treatment have been discussed with the patient and family. After consideration of risks, benefits and other options for treatment, the patient has consented to  Procedure(s): Left Heart Cath and Coronary Angiography (N/A) as a surgical intervention .  The patient's history has been reviewed, patient examined, no change in status, stable for surgery.  I have reviewed the patient's chart and labs.  Questions were answered to the patient's satisfaction.     Sean Paul S.

## 2015-06-10 ENCOUNTER — Telehealth: Payer: Self-pay | Admitting: Cardiology

## 2015-06-10 ENCOUNTER — Encounter (HOSPITAL_COMMUNITY): Payer: Self-pay | Admitting: Physician Assistant

## 2015-06-10 LAB — CBC
HEMATOCRIT: 36 % — AB (ref 39.0–52.0)
Hemoglobin: 12.4 g/dL — ABNORMAL LOW (ref 13.0–17.0)
MCH: 32 pg (ref 26.0–34.0)
MCHC: 34.4 g/dL (ref 30.0–36.0)
MCV: 92.8 fL (ref 78.0–100.0)
PLATELETS: 217 10*3/uL (ref 150–400)
RBC: 3.88 MIL/uL — AB (ref 4.22–5.81)
RDW: 11.9 % (ref 11.5–15.5)
WBC: 6.3 10*3/uL (ref 4.0–10.5)

## 2015-06-10 LAB — BASIC METABOLIC PANEL
ANION GAP: 7 (ref 5–15)
BUN: 5 mg/dL — AB (ref 6–20)
CO2: 24 mmol/L (ref 22–32)
Calcium: 8.9 mg/dL (ref 8.9–10.3)
Chloride: 106 mmol/L (ref 101–111)
Creatinine, Ser: 0.68 mg/dL (ref 0.61–1.24)
GFR calc Af Amer: >60 mL/min (ref >60)
GFR calc non Af Amer: >60 mL/min (ref >60)
Glucose, Bld: 102 mg/dL — ABNORMAL HIGH (ref 65–99)
Potassium: 3.6 mmol/L (ref 3.5–5.1)
SODIUM: 137 mmol/L (ref 135–145)

## 2015-06-10 MED ORDER — NITROGLYCERIN 0.4 MG SL SUBL: 0.4000 mg | SUBLINGUAL_TABLET | SUBLINGUAL | Status: AC | PRN

## 2015-06-10 MED ORDER — METOPROLOL TARTRATE 25 MG PO TABS: 25.0000 mg | ORAL_TABLET | Freq: Two times a day (BID) | ORAL | Status: DC

## 2015-06-10 MED ORDER — ATORVASTATIN CALCIUM 80 MG PO TABS: 80.0000 mg | ORAL_TABLET | Freq: Every evening | ORAL | Status: DC

## 2015-06-10 MED ORDER — ASPIRIN EC 81 MG PO TBEC: 81.0000 mg | DELAYED_RELEASE_TABLET | Freq: Every day | ORAL | Status: AC

## 2015-06-10 MED ORDER — TICAGRELOR 90 MG PO TABS: 90.0000 mg | ORAL_TABLET | Freq: Two times a day (BID) | ORAL | Status: DC

## 2015-06-10 MED ORDER — ISOSORBIDE MONONITRATE ER 30 MG PO TB24: 30.0000 mg | ORAL_TABLET | Freq: Every day | ORAL | Status: DC

## 2015-06-10 NOTE — Care Management Note (Signed)
Case Management Note  Patient Details  Name: Sean Paul MRN: [id number] Date of Birth: 07-29-1956  Subjective/Objective:                   NSTEMI Action/Plan: Discharge planning  Expected Discharge Date:  06/10/15               Expected Discharge Plan:     In-House Referral:     Discharge planning Services  CM Consult, Medication Assistance  Post Acute Care Choice:    Choice offered to:     DME Arranged:    DME Agency:     HH Arranged:    [place] Agency:     Status of Service:  Completed, signed off  Medicare Important Message Given:    Date Medicare IM Given:    Medicare IM give by:    Date Additional Medicare IM Given:    Additional Medicare Important Message give by:     If discussed at [place] of Stay Meetings, dates discussed:    Additional Comments: CM met with pt and gave him the Brilinta book with a 30 day free trial card.  Pt verbalized understanding the free 30 day card will cover today's prescription and give insurance enough time to authorize the medication for refills.  No other CM needs were communicated. Dellie Catholic, RN 06/10/2015, 9:28 AM

## 2015-06-10 NOTE — Discharge Summary (Signed)
Discharge Summary   Patient ID: Sean Paul MRN: [id number], DOB/AGE: [birth year] 59 y.o. Admit date: 06/06/2015 D/C date:     06/10/2015  Primary Care Provider: No primary care provider on file. Primary Cardiologist: Dr. Irish Lack   Primary Discharge Diagnoses:  1. Likely inferior MI/newly diagnosed CAD s/p DES to [facility] after thrombectomy with moderate residual LAD disease 2. Hypotension, resolved 3. Sinus bradycardia 4. Hyperlipidemia 5. H/o hypertriglyceridemia 6. Remote tobacco abuse 7. Ischemic cardiomyopathy EF 35-45%  Secondary Discharge Diagnoses:  1. H/o cervical fusion  Hospital Course: Sean Paul is a 59 y/o M with history of hypertriglyceridemia and remote tobacco abuse who presented to [facility] ER with chest. For the week prior to admission he had noticed some tightness and dyspnea with heavy exertion at work. 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 Once he got to working again, pain recurred and this time lasted a few hours with mild intensity. He drove home from [facility] and presented to [facility] ED. There, his ECG showed SR with possible inferior Qs albeit small and mildly elevated troponin of 0.016 prompting request for transfer.Upon arrival to [facility] he reported mild recurrence of chest discomfort with troponin here up to 2. ECG with inferior Q waves and TWI that are new from previous.His symptoms were felt consistent with NSTEMI/inferior MI. He was placed on ASA, full dose Lovenox, NTG gtt, low dose BB, high dose statin, IIb/IIIa inhibitors. Despite attempts to cool off he had ongoing chest discomfort with EKG revealing evolving inferior infarct. He was referred for urgent cath which  showed Mid RCA lesion, 99% stenosed with heavy thrombus and moderate disease in the proximal LAD. Dr. Irish Lack recommended to continue IIb/IIIa inhibitors for 48 hours and plan relook cath in several days to look for thrombus resolution in the RCA, with subsequent decreased chance of embolization during PCI. He was also kept on Lovenox. His troponins did climb further to a peak of 22. He was also hypotensive after his cath, possibly contributed by potential RV involvement and osmotic diuresis from contrast. IV NTG was weaned and he was given IV fluids. IV NTG was eventually able to be changed to Imdur once hypotension improved and metoprolol was increased. He went back to cath lab 06/09/15 with subsequent successful drug-eluting stent placement to the mid right coronary artery after thrombectomy. LVEF 35-45%. He was not discharged on ACEI/ARB/spironolactone because his blood pressure tends to run in the 1-teens range and we were concerned for worsening hypotension. The patient feels well today. He will be seen by cardiac rehab prior to discharge today for education and referral. We will ask him to remain out of work until cleared in follow-up. Work note provided. Also gave 30 day free Brilinta rx with regular rx refills. I have sent a message to our [facility] office's scheduler requesting a follow-up appointment, and our office will call the patient with this information. Dr. Warren Lacy has seen and examined the patient today and feels he is stable for discharge.  Given statin titration will likely need arrangement of LFTs/lipids in 6 weeks. This admission: Tchol 111, trig 75, HDL 30, LDL 66. Would also consider outpatient echo to reassess LVEF given dysfunction by cath this admission. He received education from cardiac  rehab regarding CHF monitoring, daily weights, salt/fluid restriction.    Discharge Vitals: Blood pressure 116/51, pulse 71, temperature 98.1 F (36.7 C), temperature source Oral, resp. rate 19,  height 5\' 10"  (1.778 m), weight 179 lb 3.7 oz (81.3 kg), SpO2 98 %.  Labs: Lab Results  Component Value Date   WBC 6.3 06/10/2015   HGB 12.4* 06/10/2015   HCT 36.0* 06/10/2015   MCV 92.8 06/10/2015   PLT 217 06/10/2015    Recent Labs Lab 06/06/15 1808  06/10/15 0340  NA 135  < > 137  K 3.7  < > 3.6  CL 99*  < > 106  CO2 24  < > 24  BUN 12  < > 5*  CREATININE 0.74  < > 0.68  CALCIUM 9.1  < > 8.9  PROT 8.3*  --   --   BILITOT 0.6  --   --   ALKPHOS 72  --   --   ALT 21  --   --   AST 28  --   --   GLUCOSE 155*  < > 102*  < > = values in this interval not displayed.  Recent Labs  06/07/15 1155  TROPONINI 22.86*   Lab Results  Component Value Date   CHOL 111 06/08/2015   HDL 30* 06/08/2015   LDLCALC 66 06/08/2015   TRIG 75 06/08/2015    Diagnostic Studies/Procedures   Dg Chest Portable 1 View  06/06/2015   CLINICAL DATA:  Chest pain. Central chest pressure for 1 week. Pressure became pain today. Initial encounter. Intermittent shortness of breath. Nausea.  EXAM: PORTABLE CHEST - 1 VIEW  COMPARISON:  None.  FINDINGS: Cardiopericardial silhouette within normal limits. Mediastinal contours normal. Trachea midline. No airspace disease or effusion. Monitoring leads project over the chest. Lower cervical ACDF.  IMPRESSION: No active disease.   Electronically Signed   By: Dereck Ligas M.D.   On: 06/06/2015 18:16   Cardiac catheterizations this admission, please see full report and above for summary.   Discharge Medications   Current Discharge Medication List    START taking these medications   Details  atorvastatin (LIPITOR) 80 MG tablet Take 1 tablet (80 mg total) by mouth every evening. Qty: 30 tablet, Refills: 6    isosorbide mononitrate (IMDUR) 30 MG 24 hr tablet Take 1 tablet (30 mg total) by mouth daily. Qty: 30 tablet, Refills: 6    metoprolol tartrate (LOPRESSOR) 25 MG tablet Take 1 tablet (25 mg total) by mouth 2 (two) times daily. Qty: 60 tablet,  Refills: 6    nitroGLYCERIN (NITROSTAT) 0.4 MG SL tablet Place 1 tablet (0.4 mg total) under the tongue every 5 (five) minutes as needed for chest pain (up to 3 doses). Qty: 25 tablet, Refills: 3    ticagrelor (BRILINTA) 90 MG TABS tablet Take 1 tablet (90 mg total) by mouth 2 (two) times daily. Qty: 60 tablet, Refills: 11      CONTINUE these medications which have CHANGED   Details  aspirin EC 81 MG tablet Take 1 tablet (81 mg total) by mouth daily. Qty: 30 tablet, Refills: 11      CONTINUE these medications which have NOT CHANGED   Details  allopurinol (ZYLOPRIM) 100 MG tablet Take 50 mg by mouth daily.       STOP taking these medications     KRILL OIL PO      NIASPAN 1000 MG CR tablet - d/c per Dr. Percival Spanish  Disposition   The patient will be discharged in stable condition to home. Discharge Instructions    Diet - low sodium heart healthy    Complete by:  As directed      Increase activity slowly    Complete by:  As directed   No driving for 2 weeks. No lifting over 10 lbs for 4 weeks. No sexual activity for 4 weeks. You may not return to work until cleared by your cardiologist. Keep procedure site clean & dry. If you notice increased pain, swelling, bleeding or pus, call/return!  You may shower, but no soaking baths/hot tubs/pools for 1 week.   Please see multiple new medications. Your aspirin dose was also changed.  One of your heart tests showed weakness of the heart muscle this admission. This may make you more susceptible to weight gain from fluid retention, which can lead to symptoms that we call heart failure. Review these special instructions:  1. Follow a low-salt diet and watch your fluid intake. In general, you should not be taking in more than 2 liters of fluid per day (no more than 8 glasses per day). Some patients are restricted to less than 1.5 liters of fluid per day (no more than 6 glasses per day). This includes sources of water in foods like soup,  coffee, tea, milk, etc. 2. Weigh yourself on the same scale at same time of day and keep a log. 3. Call your doctor: (Anytime you feel any of the following symptoms)  - 3-4 pound weight gain in 1-2 days or 2 pounds overnight  - Shortness of breath, with or without a dry hacking cough  - Swelling in the hands, feet or stomach  - If you have to sleep on extra pillows at night in order to breathe  IT IS IMPORTANT TO LET YOUR DOCTOR KNOW EARLY ON IF YOU ARE HAVING SYMPTOMS SO WE CAN HELP YOU!          Follow-up Information    Follow up with Jettie Booze., MD.   Specialties:  Cardiology, Radiology, Interventional Cardiology   Why:  Office will call you for your followup appointment. Call office if you have not heard back in 3 days.   Contact information:   [address] [address] [phone]         Duration of Discharge Encounter: Greater than 30 minutes including physician and PA time.  SignedMelina Copa PA-C 06/10/2015, 8:12 AM  Patient seen and examined.  Plan as discussed in my rounding note for today and outlined above. Jeneen Rinks [facility]  06/10/2015  9:09 AM

## 2015-06-10 NOTE — Progress Notes (Signed)
    SUBJECTIVE:  No chest pain.  No SOB   PHYSICAL EXAM Filed Vitals:   06/09/15 2000 06/10/15 0039 06/10/15 0431 06/10/15 0500  BP:  133/74 116/51   Pulse:  72 71   Temp:  98.5 F (36.9 C) 98.1 F (36.7 C)   TempSrc:  Oral Oral   Resp: 18 20 19    Height:      Weight:  179 lb 3.7 oz (81.3 kg)  179 lb 3.7 oz (81.3 kg)  SpO2:  97% 98%    General:  No distress Lungs:  Clear Heart:  RRR Abdomen:  Positive bowel sounds, no rebound no guarding Extremities:  Right femoral without bruising or bleeding.   LABS: Lab Results  Component Value Date   TROPONINI 22.86* 06/07/2015   Results for orders placed or performed during the hospital encounter of 06/06/15 (from the past 24 hour(s))  POCT Activated clotting time     Status: None   Collection Time: 06/09/15  1:28 PM  Result Value Ref Range   Activated Clotting Time 337 seconds  Basic metabolic panel     Status: Abnormal   Collection Time: 06/10/15  3:40 AM  Result Value Ref Range   Sodium 137 135 - 145 mmol/L   Potassium 3.6 3.5 - 5.1 mmol/L   Chloride 106 101 - 111 mmol/L   CO2 24 22 - 32 mmol/L   Glucose, Bld 102 (H) 65 - 99 mg/dL   BUN 5 (L) 6 - 20 mg/dL   Creatinine, Ser 0.68 0.61 - 1.24 mg/dL   Calcium 8.9 8.9 - 10.3 mg/dL   GFR calc non Af Amer >60 >60 mL/min   GFR calc Af Amer >60 >60 mL/min   Anion gap 7 5 - 15  CBC     Status: Abnormal   Collection Time: 06/10/15  3:40 AM  Result Value Ref Range   WBC 6.3 4.0 - 10.5 K/uL   RBC 3.88 (L) 4.22 - 5.81 MIL/uL   Hemoglobin 12.4 (L) 13.0 - 17.0 g/dL   HCT 36.0 (L) 39.0 - 52.0 %   MCV 92.8 78.0 - 100.0 fL   MCH 32.0 26.0 - 34.0 pg   MCHC 34.4 30.0 - 36.0 g/dL   RDW 11.9 11.5 - 15.5 %   Platelets 217 150 - 400 K/uL    Intake/Output Summary (Last 24 hours) at 06/10/15 0737 Last data filed at 06/10/15 0431  Gross per 24 hour  Intake 1479.9 ml  Output   4000 ml  Net -2520.1 ml    ASSESSMENT AND PLAN:  INFERIOR MI:  Status post PCI and thrombectomy of the RCA  with DES.  Moderate LAD stenosis residual.  OK to discharge with TOC follow up and follow up with Dr. Irish Lack.  Meds as on MAR.     Minus Breeding 06/10/2015 7:37 AM

## 2015-06-10 NOTE — Progress Notes (Signed)
CARDIAC REHAB PHASE I   PRE:  Rate/Rhythm: 84 SR  BP:  Supine:   Sitting: 128/68  Standing:    SaO2: 98% RA  MODE:  Ambulation: 600 ft   POST:  Rate/Rhythm: 95  BP:  Supine:   Sitting: 135/67  Standing:    SaO2: 99% RA  0815-0900 Pt tolerated ambulation well, gait steady, no c/o. Reviewed MI/PCI education, MI book given. Addressed risk factor modification, restrictions, CP, NTG use & calling 911, importance of Brilinta compliance, heart healthy eating and activity progression, handouts given. Discussed daily weights, low sodium diet, and CHF folder given due to low EF. Discussed Phase 2 cardiac rehab and referral to be sent to cardiac rehab program at [facility]. Pt verbalizes understanding of instructions given. Scherrie Bateman

## 2015-06-10 NOTE — Telephone Encounter (Signed)
Pt called, said he has not taken Allopurinol in some time, I suggested he did not have to resume this.  Kerin Ransom PA-C 06/10/2015 1:42 PM

## 2015-06-12 ENCOUNTER — Telehealth: Payer: Self-pay | Admitting: Physician Assistant

## 2015-06-12 NOTE — Telephone Encounter (Signed)
TCM per Day[place]/staff message; 8/18 @ 1130 w/ Lisbeth Renshaw

## 2015-06-12 NOTE — Telephone Encounter (Signed)
Patient contacted regarding discharge from [facility] on 06/10/15.  Patient understands to follow up with provider Melina Copa, PA  on 06/15/15 at 11:30AM at [facility] Patient understands discharge instructions? yes Patient understands medications and regiment? yes Patient understands to bring all medications to this visit? yes  Pt states he has had a mild headache since discharge from hospital, Imdur is a new medication for him. Pt states headache is tolerable,  he will be able to continue Imdur. Pt advised to try tylenol prn for headache.

## 2015-06-14 NOTE — Progress Notes (Signed)
Cardiology Office Note Date:  06/15/2015  Patient ID:  Sean Paul September 07, 1956, MRN [id number] PCP:  Glo Herring., MD  Cardiologist:  Was Dr. Irish Lack but patient lives in [place] and wants to f/u there for future appointments for convenience.  Chief Complaint: f/u stenting  History of Present Illness: Sean Paul is a 58 y.o. male with history of hypertriglyceridemia, hyperlipidemia, remote tobacco abuse, recently diagnosed CAD and ICM who presets for post-hospital follow-up. He was recently admitted with intermittent chest tightness and DOE. 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 Once he got to working again, pain recurred and this time lasted a few hours with mild intensity.He drove home from [facility] and presented to [facility] ED.There, his ECG showed SR with possible inferior Qs albeit small and mildly elevated troponin of 0.016 prompting request for transfer.Upon arrival to [facility] he reported mild recurrence of chest discomfort with troponin here up to 2. ECG with inferior Q waves and TWI that are new from previous.His symptoms were felt consistent with NSTEMI/inferior MI. He was placed on ASA, full dose Lovenox, NTG gtt, low dose BB, high dose statin, IIb/IIIa inhibitors. Despite attempts to cool off he had ongoing chest discomfort with EKG revealing evolving inferior infarct. He was referred for urgent cath which showed Mid RCA lesion, 99% stenosed with heavy thrombus and moderate disease in the proximal LAD. Dr. Irish Lack recommended to continue IIb/IIIa inhibitors for 48 hours and plan relook cath in several days to look for thrombus resolution in the RCA, with subsequent  decreased chance of embolization during PCI. He was also kept on Lovenox. His troponins did climb further to a peak of 22. He was also hypotensive after his cath, possibly contributed by potential RV involvement and osmotic diuresis from contrast. IV NTG was weaned and he was given IV fluids. IV NTG was eventually able to be changed to Imdur once hypotension improved and metoprolol was increased. He went back to cath lab 06/09/15 with subsequent successful drug-eluting stent placement to the mid right coronary artery after thrombectomy. LVEF 35-45%. He was not discharged on ACEI/ARB/spironolactone because his blood pressure tends to run in the 1-teens range and we were concerned for worsening hypotension. He was asked to remain out of work until cleared in follow-up. Statin was titrated.   He comes in today for follow-up feeling well. No further chest pain, SOB. No palpitations, bleeding, or syncope. He does report a persistent headache since discharge. No focal neuro symptoms. No LEE, orthopnea, PND, or weight gain.   Past Medical History  Diagnosis Date  . Neuromuscular disorder     prior cervical fusion  . CAD (coronary artery disease)     a. 05/2015: inferior MI/newly diagnosed CAD s/p DES to [facility] after thrombectomy with moderate residual LAD disease.  . Sinus bradycardia     a. Limiting BB titration during 05/2015 admission.  . Hyperlipidemia   . Hypertriglyceridemia   . History of tobacco abuse   . Ischemic cardiomyopathy     a. 05/2015: LVEF 35-45% by cath 05/2015.    Past Surgical History  Procedure Laterality Date  . Cervical spine surgery  12/25/2010  . Colonoscopy  06/17/2011    Procedure: COLONOSCOPY;  Surgeon: Daneil Dolin,  MD;  Location: [facility] ENDO SUITE;  Service: Endoscopy;  Laterality: N/A;  10:15 AM  . Cardiac catheterization N/A 06/07/2015    Procedure: Left Heart Cath and Coronary Angiography;  Surgeon: Jettie Booze, MD;  Location: [place] CV LAB;  Service:  Cardiovascular;  Laterality: N/A;  . Cardiac catheterization N/A 06/09/2015    Procedure: Left Heart Cath and Coronary Angiography;  Surgeon: Jettie Booze, MD;  Location: [place] CV LAB;  Service: Cardiovascular;  Laterality: N/A;  . Peripheral vascular catheterization  06/09/2015    Procedure: Thrombectomy;  Surgeon: Jettie Booze, MD;  Location: [place] CV LAB;  Service: Cardiovascular;;  . Cardiac catheterization  06/09/2015    Procedure: Coronary Stent Intervention;  Surgeon: Jettie Booze, MD;  Location: [place] CV LAB;  Service: Cardiovascular;;    Current Outpatient Prescriptions  Medication Sig Dispense Refill  . allopurinol (ZYLOPRIM) 100 MG tablet Take 50 mg by mouth daily.     Marland Kitchen aspirin EC 81 MG tablet Take 1 tablet (81 mg total) by mouth daily. 30 tablet 11  . atorvastatin (LIPITOR) 80 MG tablet Take 1 tablet (80 mg total) by mouth every evening. 30 tablet 6  . isosorbide mononitrate (IMDUR) 30 MG 24 hr tablet Take 1 tablet (30 mg total) by mouth daily. 30 tablet 6  . metoprolol tartrate (LOPRESSOR) 25 MG tablet Take 1 tablet (25 mg total) by mouth 2 (two) times daily. 60 tablet 6  . NIASPAN 1000 MG CR tablet Take 1,000 mg by mouth daily.    . nitroGLYCERIN (NITROSTAT) 0.4 MG SL tablet Place 1 tablet (0.4 mg total) under the tongue every 5 (five) minutes as needed for chest pain (up to 3 doses). 25 tablet 3  . ticagrelor (BRILINTA) 90 MG TABS tablet Take 1 tablet (90 mg total) by mouth 2 (two) times daily. 60 tablet 11   No current facility-administered medications for this visit.    Allergies:   Penicillins   Social History:  The patient  reports that he has quit smoking. His smoking use included Cigarettes. He has quit using smokeless tobacco. He reports that he drinks alcohol. He reports that he does not use illicit drugs.   Family History:  The patient's family history includes Cancer - Lung (age of onset: 67) in his brother; Heart attack (age of  onset: 66) in his brother; Heart disease (age of onset: 93) in his mother; Suicidality (age of onset: 91) in his father.  ROS:  Please see the history of present illness.    All other systems are reviewed and otherwise negative.   PHYSICAL EXAM:  VS:  BP 92/56 mmHg  Pulse 85  Ht 5\' 10"  (1.778 m)  Wt 175 lb (79.379 kg)  BMI 25.11 kg/m2 BMI: Body mass index is 25.11 kg/(m^2). Recheck BP by me 98/58. Well nourished, well developed WM, in no acute distress HEENT: normocephalic, atraumatic Neck: no JVD, carotid bruits or masses Cardiac:  normal S1, S2; RRR; no murmurs, rubs, or gallops Lungs:  clear to auscultation bilaterally, no wheezing, rhonchi or rales Abd: soft, nontender, no hepatomegaly, + BS MS: no deformity or atrophy Ext: no edema, right radial cath site with good pulse & no ecchymosis or hematoma; right groin cath site without hematoma, ecchymosis or bruit Skin: warm and dry, no rash Neuro:  moves all extremities spontaneously, no focal abnormalities noted, follows commands Psych: euthymic mood, full affect   EKG:  Done today shows NSR 85bpm, prior inferior infarct with persistent  TWI II, III, avF, V5-V6  Recent Labs: 06/06/2015: ALT 21 06/07/2015: TSH 1.076 06/10/2015: BUN 5*; Creatinine, Ser 0.68; Hemoglobin 12.4*; Platelets 217; Potassium 3.6; Sodium 137  06/08/2015: Cholesterol 111; HDL 30*; LDL Cholesterol 66; Total CHOL/HDL Ratio 3.7; Triglycerides 75; VLDL 15   Estimated Creatinine Clearance: 101.4 mL/min (by C-G formula based on Cr of 0.68).   Wt Readings from Last 3 Encounters:  06/15/15 175 lb (79.379 kg)  06/10/15 179 lb 3.7 oz (81.3 kg)  06/17/11 179 lb (81.194 kg)     Other studies reviewed: Additional studies/records reviewed today include: summarized above  ASSESSMENT AND PLAN:  1. CAD with recent inferior MI, s/p PCI - this was formally called a NSTEMI by the team during admission but EKG changes were concerning for inferior MI. He is doing well  post-PCI. Continue ASA, BB, statin, Brilinta. He is having headache and low BP that I suspect may be related to Imdur. Will d/c this today and see how he does. We discussed returning to work. He works a very physically demanding job in [employer]. Given the size of MI, his LV dysfunction and the demands for his job, we have agreed he can return to work 4 weeks post-discharge (07/10/15). I have filled out his short-term disability paperwork to reflect this. He declines to participate in cardiac rehab, citing that he plans to gradually increase activity on his own.  2. ICM EF 35-45% by cath - he has not had any evidence of clinical CHF. He is not on ACEI/ARB/spironolactone due to softer blood pressure. Will check echocardiogram to clarify LV function. If EF 35% or less we will need to consider recheck in 90 days to determine if he needs referral to EP for ICD. We reviewed salt restriction, daily weights and monitoring for further symptoms. 3. Hyperlipidemia & hx of hypertriglyceridemia - continue current regimen. F/u LFTs/lipids in 8 weeks when he returns for follow-up appointment. 4. Sinus bradycardia - HR acceptable today. This prohibited further med titration in the hospital.  Disposition: F/u in [place] to establish care in 2 months' time with lipids/LFTs at that time.  Current medicines are reviewed at length with the patient today.  The patient did not have any concerns regarding medicines.  Raechel Ache PA-C 06/15/2015 11:42 AM     [facility] [address] [phone] (office)  [phone] (fax)

## 2015-06-15 ENCOUNTER — Encounter: Payer: Self-pay | Admitting: Physician Assistant

## 2015-06-15 ENCOUNTER — Telehealth: Payer: Self-pay | Admitting: Physician Assistant

## 2015-06-15 ENCOUNTER — Ambulatory Visit (INDEPENDENT_AMBULATORY_CARE_PROVIDER_SITE_OTHER): Payer: 59 | Admitting: Physician Assistant

## 2015-06-15 VITALS — BP 92/56 | HR 85 | Ht 70.0 in | Wt 175.0 lb

## 2015-06-15 DIAGNOSIS — I255 Ischemic cardiomyopathy: Secondary | ICD-10-CM

## 2015-06-15 DIAGNOSIS — E785 Hyperlipidemia, unspecified: Secondary | ICD-10-CM | POA: Diagnosis not present

## 2015-06-15 DIAGNOSIS — Z9861 Coronary angioplasty status: Secondary | ICD-10-CM | POA: Diagnosis not present

## 2015-06-15 DIAGNOSIS — Z87891 Personal history of nicotine dependence: Secondary | ICD-10-CM | POA: Insufficient documentation

## 2015-06-15 DIAGNOSIS — I2510 Atherosclerotic heart disease of native coronary artery without angina pectoris: Secondary | ICD-10-CM

## 2015-06-15 DIAGNOSIS — R001 Bradycardia, unspecified: Secondary | ICD-10-CM | POA: Diagnosis not present

## 2015-06-15 DIAGNOSIS — I214 Non-ST elevation (NSTEMI) myocardial infarction: Secondary | ICD-10-CM

## 2015-06-15 DIAGNOSIS — I222 Subsequent non-ST elevation (NSTEMI) myocardial infarction: Secondary | ICD-10-CM

## 2015-06-15 NOTE — Patient Instructions (Signed)
Medication Instructions:  STOP Imdur  Labwork: Your physician recommends that you return for a FASTING lipid profile and lft in 2 months ([place] office)  Testing/Procedures: Your physician has requested that you have an echocardiogram. Echocardiography is a painless test that uses sound waves to create images of your heart. It provides your doctor with information about the size and shape of your heart and how well your heart's chambers and valves are working. This procedure takes approximately one hour. There are no restrictions for this procedure. (To be performed in [place])   Follow-Up: Your physician recommends that you schedule a follow-up appointment in: 2 months ([place] office)    Any Other Special Instructions Will Be Listed Below (If Applicable).

## 2015-06-15 NOTE — Telephone Encounter (Signed)
Echo scheduled at [facility] 8/26 arrive at 8:15

## 2015-06-23 ENCOUNTER — Ambulatory Visit (HOSPITAL_COMMUNITY)
Admission: RE | Admit: 2015-06-23 | Discharge: 2015-06-23 | Disposition: A | Discharge status: Home / Self Care | Payer: 59 | Source: Ambulatory Visit | Attending: Physician Assistant | Admitting: Physician Assistant

## 2015-06-23 DIAGNOSIS — I340 Nonrheumatic mitral (valve) insufficiency: Secondary | ICD-10-CM | POA: Diagnosis not present

## 2015-06-23 DIAGNOSIS — I071 Rheumatic tricuspid insufficiency: Secondary | ICD-10-CM | POA: Diagnosis not present

## 2015-06-23 DIAGNOSIS — I252 Old myocardial infarction: Secondary | ICD-10-CM | POA: Diagnosis not present

## 2015-06-23 DIAGNOSIS — I255 Ischemic cardiomyopathy: Secondary | ICD-10-CM | POA: Insufficient documentation

## 2015-06-23 DIAGNOSIS — I358 Other nonrheumatic aortic valve disorders: Secondary | ICD-10-CM | POA: Insufficient documentation

## 2015-07-11 ENCOUNTER — Telehealth: Payer: Self-pay

## 2015-07-11 NOTE — Telephone Encounter (Signed)
Prior auth for Brilinta 90mg  sent to [facility] via Cover My Meds.

## 2015-07-13 ENCOUNTER — Telehealth: Payer: Self-pay

## 2015-07-13 NOTE — Telephone Encounter (Signed)
PA obtained, patient notified.

## 2015-07-13 NOTE — Telephone Encounter (Signed)
Spoke with a rep at [employer]. Brilinta 90 mg approved for 1 year. [id number]. Pharmacy and patient notified.

## 2015-07-13 NOTE — Telephone Encounter (Signed)
Follow up     Pt want samples of brilinta.  He is out.  Also, pt cannot afford this medication.  Ins will not pay.  Is there something else he can take?

## 2015-07-13 NOTE — Telephone Encounter (Signed)
LMTCB

## 2015-07-13 NOTE — Telephone Encounter (Signed)
Patient called in stating pharmacy will not refill his Brilinta. He would like a call back to discuss this further. I explained that he should probably call his insurance company or even ask the pharmacy why it will not be filled/paid for by insurance but he stated he just wanted to know "if the Kary Kos is even important to take" I told him I would send a message to have someone call him back.    Follow-up Information    Follow up with Jettie Booze., MD.   Specialties: Cardiology, Radiology, Interventional Cardiology   Why: Office will call you for your followup appointment. Call office if you have not heard back in 3 days.   Contact information:   [address] [place] [address] [phone]

## 2015-07-13 NOTE — Telephone Encounter (Signed)
Patient reported during initial call that Rx for Brilinta was unable to be filled by his pharmacy. Attempted to call patient but no answer. RN had initially lmtcb for patient and still awaiting his callback. To determine barrier in getting Rx filled, I called [facility] in [place], [place]. Pharmacist stated that PA (prior authorization) was denied by insurance carrier. Will route this message to Jenean Lindau, LPN, for possible appeal.

## 2015-08-09 ENCOUNTER — Other Ambulatory Visit: Payer: Self-pay | Admitting: *Deleted

## 2015-08-09 DIAGNOSIS — E785 Hyperlipidemia, unspecified: Secondary | ICD-10-CM

## 2015-08-11 ENCOUNTER — Encounter: Payer: Self-pay | Admitting: *Deleted

## 2015-08-11 ENCOUNTER — Ambulatory Visit (INDEPENDENT_AMBULATORY_CARE_PROVIDER_SITE_OTHER): Payer: 59 | Admitting: Cardiovascular Disease

## 2015-08-11 ENCOUNTER — Encounter: Payer: Self-pay | Admitting: Cardiovascular Disease

## 2015-08-11 VITALS — BP 123/78 | HR 62 | Ht 70.0 in | Wt 175.0 lb

## 2015-08-11 DIAGNOSIS — E785 Hyperlipidemia, unspecified: Secondary | ICD-10-CM

## 2015-08-11 DIAGNOSIS — I2510 Atherosclerotic heart disease of native coronary artery without angina pectoris: Secondary | ICD-10-CM

## 2015-08-11 DIAGNOSIS — Z87891 Personal history of nicotine dependence: Secondary | ICD-10-CM

## 2015-08-11 DIAGNOSIS — I255 Ischemic cardiomyopathy: Secondary | ICD-10-CM | POA: Diagnosis not present

## 2015-08-11 DIAGNOSIS — R001 Bradycardia, unspecified: Secondary | ICD-10-CM

## 2015-08-11 DIAGNOSIS — Z9861 Coronary angioplasty status: Secondary | ICD-10-CM

## 2015-08-11 DIAGNOSIS — I214 Non-ST elevation (NSTEMI) myocardial infarction: Secondary | ICD-10-CM

## 2015-08-11 NOTE — Patient Instructions (Signed)
Your physician recommends that you schedule a follow-up appointment in: 3 months with Dr. Koneswaran  Your physician recommends that you continue on your current medications as directed. Please refer to the Current Medication list given to you today.  Thank you for choosing [facility]!    

## 2015-08-11 NOTE — Progress Notes (Signed)
Patient ID: Sean Paul, male   DOB: May 17, 1956, 59 y.o.   MRN: [id number]      SUBJECTIVE: The patient is a 59 year old male with relatively recently diagnosed coronary artery disease sustaining an inferior myocardial infarction in August 2016 with an associated ischemic cardiomyopathy. This is my first time meeting him. Next  Echocardiogram on 06/23/15 demonstrated a marked improvement in left ventricular systolic function, LVEF 48% with hypokinesis of the basal-mid inferior myocardium with associated grade 1 diastolic dysfunction. There was no significant valvular regurgitation.  Currently denies exertional chest pain and shortness of breath. He occasionally has a "stinging sensation" in his chest which lasts only seconds. He seldom has "heart fluttering". He denies associated dizziness and lightheadedness, as well as leg swelling. He has gone back to work doing [employer] work.   Review of Systems: As per "subjective", otherwise negative.  Allergies  Allergen Reactions  . Penicillins Itching and Swelling    Current Outpatient Prescriptions  Medication Sig Dispense Refill  . allopurinol (ZYLOPRIM) 100 MG tablet Take 50 mg by mouth daily.     Marland Kitchen aspirin EC 81 MG tablet Take 1 tablet (81 mg total) by mouth daily. 30 tablet 11  . atorvastatin (LIPITOR) 80 MG tablet Take 1 tablet (80 mg total) by mouth every evening. 30 tablet 6  . metoprolol tartrate (LOPRESSOR) 25 MG tablet Take 1 tablet (25 mg total) by mouth 2 (two) times daily. 60 tablet 6  . NIASPAN 1000 MG CR tablet Take 1,000 mg by mouth daily.    . nitroGLYCERIN (NITROSTAT) 0.4 MG SL tablet Place 1 tablet (0.4 mg total) under the tongue every 5 (five) minutes as needed for chest pain (up to 3 doses). 25 tablet 3  . ticagrelor (BRILINTA) 90 MG TABS tablet Take 1 tablet (90 mg total) by mouth 2 (two) times daily. 60 tablet 11   No current facility-administered medications for this visit.    Past Medical History  Diagnosis Date    . Neuromuscular disorder ([place])     prior cervical fusion  . CAD (coronary artery disease)     a. 05/2015: inferior MI/newly diagnosed CAD s/p DES to [facility] after thrombectomy with moderate residual LAD disease.  . Sinus bradycardia     a. Limiting BB titration during 05/2015 admission.  . Hyperlipidemia   . Hypertriglyceridemia   . History of tobacco abuse   . Ischemic cardiomyopathy     a. 05/2015: LVEF 35-45% by cath 05/2015.    Past Surgical History  Procedure Laterality Date  . Cervical spine surgery  12/25/2010  . Colonoscopy  06/17/2011    Procedure: COLONOSCOPY;  Surgeon: Daneil Dolin, MD;  [facility];  Service: Endoscopy;  Laterality: N/A;  10:15 AM  . Cardiac catheterization N/A 06/07/2015    Procedure: Left Heart Cath and Coronary Angiography;  Surgeon: Jettie Booze, MD;  Location: [place] [facility];  Service: Cardiovascular;  Laterality: N/A;  . Cardiac catheterization N/A 06/09/2015    Procedure: Left Heart Cath and Coronary Angiography;  Surgeon: Jettie Booze, MD;  Location: [place] [facility];  Service: Cardiovascular;  Laterality: N/A;  . Peripheral vascular catheterization  06/09/2015    Procedure: Thrombectomy;  Surgeon: Jettie Booze, MD;  Location: [place] [facility];  Service: Cardiovascular;;  . Cardiac catheterization  06/09/2015    Procedure: Coronary Stent Intervention;  Surgeon: Jettie Booze, MD;  Location: [place] [facility];  Service: Cardiovascular;;    Social History  Social History  . Marital Status: Single    Spouse Name: N/A  . Number of Children: N/A  . Years of Education: N/A   Occupational History  . Not on file.   Social History Main Topics  . Smoking status: Former Smoker -- 1.00 packs/day for 17 years    Types: Cigarettes    Start date: 05/09/1973    Quit date: 02/25/1990  . Smokeless tobacco: Former Systems developer    Types: Snuff    Quit date: 02/25/1990  . Alcohol Use: 0.0 oz/week    0 Standard drinks or  equivalent per week  . Drug Use: No  . Sexual Activity: Not on file   Other Topics Concern  . Not on file   Social History Narrative     Filed Vitals:   08/11/15 0839  BP: 123/78  Pulse: 62  Height: 5\' 10"  (1.778 m)  Weight: 175 lb (79.379 kg)    PHYSICAL EXAM General: NAD HEENT: Normal. Neck: No JVD, no thyromegaly. Lungs: Clear to auscultation bilaterally with normal respiratory effort. CV: Nondisplaced PMI.  Regular rate and rhythm, normal S1/S2, no S3/S4, no murmur. No pretibial or periankle edema.  No carotid bruit.  Normal pedal pulses.  Abdomen: Soft, nontender, no hepatosplenomegaly, no distention.  Neurologic: Alert and oriented x 3.  Psych: Somewhat flat affect. Skin: Normal. Musculoskeletal: No gross deformities. Extremities: No clubbing or cyanosis.   ECG: Most recent ECG reviewed.      ASSESSMENT AND PLAN: 1. CAD with relatively recent inferior MI, s/p PCI: Symptomatically stable. Continue ASA, metoprolol, atorvastatin, and Brilinta. Did not tolerate Imdur as it caused headaches. He again declines to participate in cardiac rehab, citing that he planned to gradually increase activity on his own.   2. ICM EF 35-45% by cath, now improved to 55%: He has not had any evidence of clinical CHF. He is not on ACEI/ARB/spironolactone due to softer blood pressure.  Continue metoprolol.   3. Hyperlipidemia & hx of hypertriglyceridemia: Continue current regimen. Will obtain repeat lipids and LFT's.  4. Sinus bradycardia: HR acceptable today. This prohibited further med titration in the hospital.  Dispo: f/u 3 months.  Kate Sable, M.D., F.A.C.C.

## 2015-08-11 NOTE — Addendum Note (Signed)
Addended by: Julian Hy T on: 08/11/2015 09:16 AM   Modules accepted: Orders

## 2015-08-18 ENCOUNTER — Other Ambulatory Visit: Payer: Self-pay | Admitting: Cardiovascular Disease

## 2015-08-18 LAB — HEPATIC FUNCTION PANEL
ALBUMIN: 4.1 g/dL (ref 3.6–5.1)
ALK PHOS: 58 U/L (ref 40–115)
ALT: 36 U/L (ref 9–46)
AST: 28 U/L (ref 10–35)
BILIRUBIN DIRECT: 0.3 mg/dL — AB (ref <=0.2)
BILIRUBIN TOTAL: 1.1 mg/dL (ref 0.2–1.2)
Indirect Bilirubin: 0.8 mg/dL (ref 0.2–1.2)
Total Protein: 6.8 g/dL (ref 6.1–8.1)

## 2015-08-18 LAB — LIPID PANEL
Cholesterol: 77 mg/dL — ABNORMAL LOW (ref 125–200)
HDL: 28 mg/dL — AB (ref >=40)
LDL CALC: 36 mg/dL (ref <130)
Total CHOL/HDL Ratio: 2.8 Ratio (ref <=5.0)
Triglycerides: 67 mg/dL (ref <150)
VLDL: 13 mg/dL (ref <30)

## 2015-08-21 ENCOUNTER — Telehealth: Payer: Self-pay | Admitting: *Deleted

## 2015-08-21 NOTE — Telephone Encounter (Signed)
Left message to call back  

## 2015-08-21 NOTE — Telephone Encounter (Signed)
-----   Message from Herminio Commons, MD sent at 08/21/2015  9:00 AM EDT ----- [place].

## 2015-11-17 ENCOUNTER — Encounter: Payer: Self-pay | Admitting: Cardiovascular Disease

## 2015-11-17 ENCOUNTER — Ambulatory Visit (INDEPENDENT_AMBULATORY_CARE_PROVIDER_SITE_OTHER): Payer: 59 | Admitting: Cardiovascular Disease

## 2015-11-17 VITALS — BP 132/77 | HR 59 | Ht 70.0 in | Wt 181.0 lb

## 2015-11-17 DIAGNOSIS — R001 Bradycardia, unspecified: Secondary | ICD-10-CM | POA: Diagnosis not present

## 2015-11-17 DIAGNOSIS — Z9861 Coronary angioplasty status: Secondary | ICD-10-CM

## 2015-11-17 DIAGNOSIS — E785 Hyperlipidemia, unspecified: Secondary | ICD-10-CM

## 2015-11-17 DIAGNOSIS — I255 Ischemic cardiomyopathy: Secondary | ICD-10-CM

## 2015-11-17 DIAGNOSIS — I2510 Atherosclerotic heart disease of native coronary artery without angina pectoris: Secondary | ICD-10-CM | POA: Diagnosis not present

## 2015-11-17 NOTE — Patient Instructions (Signed)
Your physician wants you to follow-up in: 6 months with Dr. Koneswaran. You will receive a reminder letter in the mail two months in advance. If you don't receive a letter, please call our office to schedule the follow-up appointment.  Your physician recommends that you continue on your current medications as directed. Please refer to the Current Medication list given to you today.  Thank you for choosing [facility]!   

## 2015-11-17 NOTE — Progress Notes (Signed)
Patient ID: Sean Paul, male   DOB: 03-Nov-1955, 60 y.o.   MRN: [id number]      SUBJECTIVE: The patient presents for routine follow up. He has a h/o coronary artery disease and sustained an inferior myocardial infarction in August 2016 with an associated ischemic cardiomyopathy.   Echocardiogram on 06/23/15 demonstrated a marked improvement in left ventricular systolic function, LVEF [id number] with hypokinesis of the basal-mid inferior myocardium with associated grade 1 diastolic dysfunction. There was no significant valvular regurgitation.  The patient denies any symptoms of chest pain, palpitations, shortness of breath, lightheadedness, leg swelling, orthopnea, PND, and syncope. Had some mild dizziness this morning for first time in several months.   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   Review of Systems: As per "subjective", otherwise negative.  Allergies  Allergen Reactions  . Penicillins Itching and Swelling    Current Outpatient Prescriptions  Medication Sig Dispense Refill  . allopurinol (ZYLOPRIM) 100 MG tablet Take 50 mg by mouth daily.     Marland Kitchen aspirin EC 81 MG tablet Take 1 tablet (81 mg total) by mouth daily. 30 tablet 11  . atorvastatin (LIPITOR) 80 MG tablet Take 1 tablet (80 mg total) by mouth every evening. 30 tablet 6  . metoprolol tartrate (LOPRESSOR) 25 MG tablet Take 1 tablet (25 mg total) by mouth 2 (two) times daily. 60 tablet 6  . NIASPAN 1000 MG CR tablet Take 1,000 mg by mouth daily.    . nitroGLYCERIN (NITROSTAT) 0.4 MG SL tablet Place 1 tablet (0.4 mg total) under the tongue every 5 (five) minutes as needed for chest pain (up to 3 doses). 25 tablet 3  . ticagrelor (BRILINTA) 90 MG TABS tablet Take 1 tablet (90 mg total) by mouth 2 (two) times daily. 60 tablet 11   No current facility-administered medications for this visit.    Past Medical History  Diagnosis Date  . Neuromuscular disorder ([place])     prior cervical fusion  . CAD (coronary artery disease)     a. 05/2015:  inferior MI/newly diagnosed CAD s/p DES to [facility] after thrombectomy with moderate residual LAD disease.  . Sinus bradycardia     a. Limiting BB titration during 05/2015 admission.  . Hyperlipidemia   . Hypertriglyceridemia   . History of tobacco abuse   . Ischemic cardiomyopathy     a. 05/2015: LVEF 35-45% by cath 05/2015.    Past Surgical History  Procedure Laterality Date  . Cervical spine surgery  12/25/2010  . Colonoscopy  06/17/2011    Procedure: COLONOSCOPY;  Surgeon: Daneil Dolin, MD;  [facility];  Service: Endoscopy;  Laterality: N/A;  10:15 AM  . Cardiac catheterization N/A 06/07/2015    Procedure: Left Heart Cath and Coronary Angiography;  Surgeon: Jettie Booze, MD;  Location: [place] [facility];  Service: Cardiovascular;  Laterality: N/A;  . Cardiac catheterization N/A 06/09/2015    Procedure: Left Heart Cath and Coronary Angiography;  Surgeon: Jettie Booze, MD;  Location: [place] [facility] LAB;  Service: Cardiovascular;  Laterality: N/A;  . Peripheral vascular catheterization  06/09/2015    Procedure: Thrombectomy;  Surgeon: Jettie Booze, MD;  Location: [place] [facility];  Service: Cardiovascular;;  . Cardiac catheterization  06/09/2015    Procedure: Coronary Stent Intervention;  Surgeon: Jettie Booze, MD;  Location: [place] [facility];  Service: Cardiovascular;;    Social History   Social History  . Marital Status: Single    Spouse Name: N/A  . Number  of Children: N/A  . Years of Education: N/A   Occupational History  . Not on file.   Social History Main Topics  . Smoking status: Former Smoker -- 1.00 packs/day for 17 years    Types: Cigarettes    Start date: 05/09/1973    Quit date: 02/25/1990  . Smokeless tobacco: Former Systems developer    Types: Snuff    Quit date: 02/25/1990  . Alcohol Use: 0.0 oz/week    0 Standard drinks or equivalent per week  . Drug Use: No  . Sexual Activity: Not on file   Other Topics Concern  . Not on file     Social History Narrative     Filed Vitals:   11/17/15 0801  BP: 132/77  Pulse: 59  Height: 5\' 10"  (1.778 m)  Weight: 181 lb (82.101 kg)    PHYSICAL EXAM General: NAD HEENT: Normal. Neck: No JVD, no thyromegaly. Lungs: Clear to auscultation bilaterally with normal respiratory effort. CV: Nondisplaced PMI.  Regular rate and rhythm, normal S1/S2, no S3/S4, no murmur. No pretibial or periankle edema.  No carotid bruit.    Abdomen: Soft, nontender, no hepatosplenomegaly, no distention.  Neurologic: Alert and oriented x 3.  Psych: Normal affect. Skin: Normal. Musculoskeletal: Normal range of motion, no gross deformities. Extremities: No clubbing or cyanosis.   ECG: Most recent ECG reviewed.      ASSESSMENT AND PLAN: 1. CAD with relatively recent inferior MI, s/p PCI: Symptomatically stable. Continue ASA, metoprolol, atorvastatin, and Brilinta. Did not tolerate Imdur as it caused headaches.  2. ICM EF 35-45% by cath, improved to 55%: He has not had any evidence of clinical CHF. He has not been on ACEI/ARB/spironolactone due to softer blood pressures in the past. Due to dizziness this morning and not knowing what BP was at that time, will hold off on ACEI. Continue metoprolol.   3. Hyperlipidemia & hx of hypertriglyceridemia: Continue current regimen. LDL 36, TG 67 on 08/18/15.   Dispo: f/u 6 months.   Kate Sable, M.D., F.A.C.C.

## 2015-12-29 ENCOUNTER — Other Ambulatory Visit: Payer: Self-pay | Admitting: Cardiovascular Disease

## 2015-12-29 MED ORDER — METOPROLOL TARTRATE 25 MG PO TABS: 25.0000 mg | ORAL_TABLET | Freq: Two times a day (BID) | ORAL | Status: DC

## 2015-12-29 MED ORDER — ATORVASTATIN CALCIUM 80 MG PO TABS: 80.0000 mg | ORAL_TABLET | Freq: Every evening | ORAL | Status: DC

## 2015-12-29 NOTE — Telephone Encounter (Signed)
Patient requesting refills on  Lipitor 80 mg & Lopressor 25 mg [facility]

## 2016-05-17 ENCOUNTER — Encounter: Payer: Self-pay | Admitting: Cardiovascular Disease

## 2016-05-17 ENCOUNTER — Ambulatory Visit (INDEPENDENT_AMBULATORY_CARE_PROVIDER_SITE_OTHER): Payer: 59 | Admitting: Cardiovascular Disease

## 2016-05-17 VITALS — BP 128/80 | HR 62 | Ht 70.0 in | Wt 178.0 lb

## 2016-05-17 DIAGNOSIS — I2510 Atherosclerotic heart disease of native coronary artery without angina pectoris: Secondary | ICD-10-CM | POA: Diagnosis not present

## 2016-05-17 DIAGNOSIS — Z955 Presence of coronary angioplasty implant and graft: Secondary | ICD-10-CM | POA: Diagnosis not present

## 2016-05-17 DIAGNOSIS — E785 Hyperlipidemia, unspecified: Secondary | ICD-10-CM | POA: Diagnosis not present

## 2016-05-17 DIAGNOSIS — I255 Ischemic cardiomyopathy: Secondary | ICD-10-CM | POA: Diagnosis not present

## 2016-05-17 MED ORDER — TICAGRELOR 90 MG PO TABS: 90.0000 mg | ORAL_TABLET | Freq: Two times a day (BID) | ORAL | Status: AC

## 2016-05-17 MED ORDER — TICAGRELOR 60 MG PO TABS: 60.0000 mg | ORAL_TABLET | Freq: Two times a day (BID) | ORAL | Status: DC

## 2016-05-17 MED ORDER — TICAGRELOR 90 MG PO TABS: 90.0000 mg | ORAL_TABLET | Freq: Two times a day (BID) | ORAL | Status: DC

## 2016-05-17 NOTE — Patient Instructions (Addendum)
Medication Instructions:  Your physician has recommended you make the following change in your medication: Decrease brilinta to 60 mg twice daily starting June 28, 2016. Continue all other medications the same.  Labwork: NONE  Testing/Procedures: NONE  Follow-Up: Your physician recommends that you schedule a follow-up appointment in: 6 months. You will receive a reminder letter in the mail in about 4 months reminding you to call and schedule your appointment. If you don't receive this letter, please contact our office.  Any Other Special Instructions Will Be Listed Below (If Applicable).  If you need a refill on your cardiac medications before your next appointment, please call your pharmacy.

## 2016-05-17 NOTE — Addendum Note (Signed)
Addended by: Merlene Laughter on: 05/17/2016 08:38 AM   Modules accepted: Orders, Medications

## 2016-05-17 NOTE — Addendum Note (Signed)
Addended by: Merlene Laughter on: 05/17/2016 08:47 AM   Modules accepted: Orders

## 2016-05-17 NOTE — Progress Notes (Signed)
Patient ID: Sean Paul, male   DOB: May 28, 1956, 60 y.o.   MRN: [id number]      SUBJECTIVE: The patient presents for routine follow up. He has a h/o coronary artery disease and sustained an inferior myocardial infarction in August 2016 with an associated ischemic cardiomyopathy.   Echocardiogram on 06/23/15 demonstrated a marked improvement in left ventricular systolic function, LVEF [id number] with hypokinesis of the basal-mid inferior myocardium with associated grade 1 diastolic dysfunction. There was no significant valvular regurgitation.  The patient denies any symptoms of chest pain, palpitations, shortness of breath, lightheadedness, leg swelling, orthopnea, PND, and syncope.   Review of Systems: As per "subjective", otherwise negative.  Allergies  Allergen Reactions  . Penicillins Itching and Swelling    Current Outpatient Prescriptions  Medication Sig Dispense Refill  . allopurinol (ZYLOPRIM) 100 MG tablet Take 50 mg by mouth daily.     Marland Kitchen aspirin EC 81 MG tablet Take 1 tablet (81 mg total) by mouth daily. 30 tablet 11  . atorvastatin (LIPITOR) 80 MG tablet Take 1 tablet (80 mg total) by mouth every evening. 30 tablet 6  . Coenzyme Q10 (COQ-10) 100 MG CAPS Take 1 capsule by mouth daily.    [name] Oil 500 MG CAPS Take 1 capsule by mouth every morning.    . metoprolol tartrate (LOPRESSOR) 25 MG tablet Take 1 tablet (25 mg total) by mouth 2 (two) times daily. 60 tablet 6  . NIASPAN 1000 MG CR tablet Take 1,000 mg by mouth daily.    . nitroGLYCERIN (NITROSTAT) 0.4 MG SL tablet Place 1 tablet (0.4 mg total) under the tongue every 5 (five) minutes as needed for chest pain (up to 3 doses). 25 tablet 3  . Omega-3 Fatty Acids (FISH OIL) 1200 MG CAPS Take 1 capsule by mouth every evening.    . ticagrelor (BRILINTA) 90 MG TABS tablet Take 1 tablet (90 mg total) by mouth 2 (two) times daily. 60 tablet 11   No current facility-administered medications for this visit.    Past Medical  History  Diagnosis Date  . Neuromuscular disorder ([place])     prior cervical fusion  . CAD (coronary artery disease)     a. 05/2015: inferior MI/newly diagnosed CAD s/p DES to [facility] after thrombectomy with moderate residual LAD disease.  . Sinus bradycardia     a. Limiting BB titration during 05/2015 admission.  . Hyperlipidemia   . Hypertriglyceridemia   . History of tobacco abuse   . Ischemic cardiomyopathy     a. 05/2015: LVEF 35-45% by cath 05/2015.    Past Surgical History  Procedure Laterality Date  . Cervical spine surgery  12/25/2010  . Colonoscopy  06/17/2011    Procedure: COLONOSCOPY;  Surgeon: Daneil Dolin, MD;  [facility];  Service: Endoscopy;  Laterality: N/A;  10:15 AM  . Cardiac catheterization N/A 06/07/2015    Procedure: Left Heart Cath and Coronary Angiography;  Surgeon: Jettie Booze, MD;  Location: [place] [facility] LAB;  Service: Cardiovascular;  Laterality: N/A;  . Cardiac catheterization N/A 06/09/2015    Procedure: Left Heart Cath and Coronary Angiography;  Surgeon: Jettie Booze, MD;  Location: [place] [facility] LAB;  Service: Cardiovascular;  Laterality: N/A;  . Peripheral vascular catheterization  06/09/2015    Procedure: Thrombectomy;  Surgeon: Jettie Booze, MD;  Location: [place] [facility] LAB;  Service: Cardiovascular;;  . Cardiac catheterization  06/09/2015    Procedure: Coronary Stent Intervention;  Surgeon: Jettie Booze,  MD;  Location: [place] [facility];  Service: Cardiovascular;;    Social History   Social History  . Marital Status: Single    Spouse Name: N/A  . Number of Children: N/A  . Years of Education: N/A   Occupational History  . Not on file.   Social History Main Topics  . Smoking status: Former Smoker -- 1.00 packs/day for 17 years    Types: Cigarettes    Start date: 05/09/1973    Quit date: 02/25/1990  . Smokeless tobacco: Former Systems developer    Types: Snuff    Quit date: 02/25/1990  . Alcohol Use: 0.0 oz/week      0 Standard drinks or equivalent per week  . Drug Use: No  . Sexual Activity: Not on file   Other Topics Concern  . Not on file   Social History Narrative     Filed Vitals:   05/17/16 0801  BP: 128/80  Pulse: 62  Height: 5\' 10"  (1.778 m)  Weight: 178 lb (80.74 kg)    PHYSICAL EXAM General: NAD HEENT: Normal. Neck: No JVD, no thyromegaly. Lungs: Clear to auscultation bilaterally with normal respiratory effort. CV: Nondisplaced PMI.  Regular rate and rhythm, normal S1/S2, no S3/S4, no murmur. No pretibial or periankle edema.  No carotid bruit.   Abdomen: Soft, nontender, no distention.  Neurologic: Alert and oriented.  Psych: Normal affect. Skin: Normal. Musculoskeletal: No gross deformities.    ECG: Most recent ECG reviewed.      ASSESSMENT AND PLAN: 1. CAD with inferior MI s/p PCI: Symptomatically stable. Continue ASA, metoprolol, atorvastatin, and Brilinta (reduce to 60 mg bid in September). Did not tolerate Imdur as it caused headaches.  2. ICM EF 35-45% by cath, improved to 55%: He has not had any evidence of clinical CHF. He has not been on ACEI/ARB/spironolactone due to softer blood pressures in the past. Continue metoprolol.   3. Hyperlipidemia & hx of hypertriglyceridemia: Continue current regimen. LDL 36, TG 67 on 08/18/15.   Dispo: f/u 6 months.   Kate Sable, M.D., F.A.C.C.

## 2016-05-20 ENCOUNTER — Telehealth: Payer: Self-pay | Admitting: Cardiovascular Disease

## 2016-05-20 NOTE — Telephone Encounter (Signed)
ticagrelor (BRILINTA) 60 MG TABS tablet   Patient would like what other alternatives he has for medication.   60 mg is more expensive than the 90mg 

## 2016-05-21 NOTE — Telephone Encounter (Signed)
Can try Effient 10 mg

## 2016-05-22 MED ORDER — PRASUGREL HCL 10 MG PO TABS: 10.0000 mg | ORAL_TABLET | Freq: Every day | ORAL | 3 refills | Status: DC

## 2016-05-22 NOTE — Telephone Encounter (Signed)
Patient informed and verbalized understanding of plan. 

## 2016-05-24 ENCOUNTER — Telehealth: Payer: Self-pay | Admitting: Cardiovascular Disease

## 2016-05-24 NOTE — Telephone Encounter (Signed)
Patient informed that a savings voucher for 12 mth free effient will be provided for him and he needed to activate it. Also advised patient that nurse could assist him with printing off the first 30 day free voucher from [email or web address] Patient said he will come to the office to pick up voucher and get help with 30 day free offer.

## 2016-05-24 NOTE — Telephone Encounter (Signed)
Sean Paul walked in office today stating that new RX called in is still going to cost $50.00.  He is wanting to know how long will he need to be on this medication and if he can get an RX that does not cost so much.

## 2016-07-02 ENCOUNTER — Other Ambulatory Visit: Payer: Self-pay | Admitting: Cardiovascular Disease

## 2016-07-19 ENCOUNTER — Other Ambulatory Visit: Payer: Self-pay | Admitting: Cardiovascular Disease

## 2016-07-19 MED ORDER — NIASPAN 1000 MG PO TBCR: 1000.0000 mg | EXTENDED_RELEASE_TABLET | Freq: Every day | ORAL | 3 refills | Status: DC

## 2016-07-19 NOTE — Telephone Encounter (Signed)
Patient walked in after picking up last RX of the following prescriptions from [place]  He is asking for new refills to be sent in  NIASPAN 1000 MG CR tablet   metoprolol tartrate (LOPRESSOR) 25 MG tablet    atorvastatin (LIPITOR) 80 MG tablet

## 2016-07-19 NOTE — Telephone Encounter (Signed)
Niaspan sent to pharmacy - metoprolol and atorvastatin sent 07/02/16 w/3 refills

## 2016-11-18 ENCOUNTER — Encounter: Payer: Self-pay | Admitting: *Deleted

## 2016-11-19 ENCOUNTER — Ambulatory Visit (INDEPENDENT_AMBULATORY_CARE_PROVIDER_SITE_OTHER): Payer: 59 | Admitting: Cardiovascular Disease

## 2016-11-19 ENCOUNTER — Encounter: Payer: Self-pay | Admitting: Cardiovascular Disease

## 2016-11-19 VITALS — BP 134/82 | HR 64 | Ht 70.0 in | Wt 186.0 lb

## 2016-11-19 DIAGNOSIS — I252 Old myocardial infarction: Secondary | ICD-10-CM

## 2016-11-19 DIAGNOSIS — Z955 Presence of coronary angioplasty implant and graft: Secondary | ICD-10-CM

## 2016-11-19 DIAGNOSIS — I2510 Atherosclerotic heart disease of native coronary artery without angina pectoris: Secondary | ICD-10-CM

## 2016-11-19 DIAGNOSIS — E782 Mixed hyperlipidemia: Secondary | ICD-10-CM | POA: Diagnosis not present

## 2016-11-19 MED ORDER — NIASPAN 1000 MG PO TBCR: 1000.0000 mg | EXTENDED_RELEASE_TABLET | Freq: Every day | ORAL | 3 refills | Status: AC

## 2016-11-19 MED ORDER — ATORVASTATIN CALCIUM 80 MG PO TABS: ORAL_TABLET | ORAL | 3 refills | Status: AC

## 2016-11-19 MED ORDER — METOPROLOL TARTRATE 25 MG PO TABS
ORAL_TABLET | ORAL | 3 refills | Status: AC
Start: 2016-11-19 — End: ?

## 2016-11-19 NOTE — Progress Notes (Signed)
SUBJECTIVE: The patient presents for routine follow up. He has a h/o coronary artery disease and sustained an inferior myocardial infarction in August 2016 with an associated ischemic cardiomyopathy.   Echocardiogram on 06/23/15 demonstrated a marked improvement in left ventricular systolic function, LVEF [id number] with hypokinesis of the basal-mid inferior myocardium with associated grade 1 diastolic dysfunction. There was no significant valvular regurgitation.  The patient denies any symptoms of chest pain, palpitations, shortness of breath, lightheadedness, leg swelling, orthopnea, PND, and syncope.  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  ECG performed today shows sinus rhythm with old inferior infarct and a nonspecific ST segment and T-wave abnormality.   Review of Systems: As per "subjective", otherwise negative.  Allergies  Allergen Reactions  . Penicillins Itching and Swelling    Current Outpatient Prescriptions  Medication Sig Dispense Refill  . allopurinol (ZYLOPRIM) 100 MG tablet Take 50 mg by mouth daily.     Marland Kitchen aspirin EC 81 MG tablet Take 1 tablet (81 mg total) by mouth daily. 30 tablet 11  . atorvastatin (LIPITOR) 80 MG tablet TAKE (1) TABLET BY MOUTH AT BEDTIME. 30 tablet 3  . Coenzyme Q10 (COQ-10) 100 MG CAPS Take 1 capsule by mouth daily.    [name] Oil 500 MG CAPS Take 1 capsule by mouth every morning.    . metoprolol tartrate (LOPRESSOR) 25 MG tablet TAKE (1) TABLET BY MOUTH TWICE DAILY. 60 tablet 3  . NIASPAN 1000 MG CR tablet Take 1 tablet (1,000 mg total) by mouth daily. 30 tablet 3  . nitroGLYCERIN (NITROSTAT) 0.4 MG SL tablet Place 1 tablet (0.4 mg total) under the tongue every 5 (five) minutes as needed for chest pain (up to 3 doses). 25 tablet 3  . Omega-3 Fatty Acids (FISH OIL) 1200 MG CAPS Take 1 capsule by mouth every evening.    . prasugrel (EFFIENT) 10 MG TABS tablet  Take 1 tablet (10 mg total) by mouth daily. Start June 28, 2016 90 tablet 3   No current facility-administered medications for this visit.     Past Medical History:  Diagnosis Date  . CAD (coronary artery disease)    a. 05/2015: inferior MI/newly diagnosed CAD s/p DES to [facility] after thrombectomy with moderate residual LAD disease.  Marland Kitchen History of tobacco abuse   . Hyperlipidemia   . Hypertriglyceridemia   . Ischemic cardiomyopathy    a. 05/2015: LVEF 35-45% by cath 05/2015.  Marland Kitchen Neuromuscular disorder ([place])    prior cervical fusion  . Sinus bradycardia    a. Limiting BB titration during 05/2015 admission.    Past Surgical History:  Procedure Laterality Date  . CARDIAC CATHETERIZATION N/A 06/07/2015   Procedure: Left Heart Cath and Coronary Angiography;  Surgeon: Jettie Booze, MD;  Location: [place] [facility];  Service: Cardiovascular;  Laterality: N/A;  . CARDIAC CATHETERIZATION N/A 06/09/2015   Procedure: Left Heart Cath and Coronary Angiography;  Surgeon: Jettie Booze, MD;  Location: [place] [facility];  Service: Cardiovascular;  Laterality: N/A;  . CARDIAC CATHETERIZATION  06/09/2015   Procedure: Coronary Stent Intervention;  Surgeon: Jettie Booze, MD;  Location: [place] [facility];  Service: Cardiovascular;;  . CERVICAL SPINE SURGERY  12/25/2010  . COLONOSCOPY  06/17/2011   Procedure: COLONOSCOPY;  Surgeon: Daneil Dolin, MD;  [facility];  Service: Endoscopy;  Laterality: N/A;  10:15 AM  . PERIPHERAL  VASCULAR CATHETERIZATION  06/09/2015   Procedure: Thrombectomy;  Surgeon: Jettie Booze, MD;  Location: [place] [facility];  Service: Cardiovascular;;    Social History   Social History  . Marital status: Single    Spouse name: N/A  . Number of children: N/A  . Years of education: N/A   Occupational History  . Not on file.   Social History Main Topics  . Smoking status: Former Smoker    Packs/day: 1.00    Years: 17.00    Types:  Cigarettes    Start date: 05/09/1973    Quit date: 02/25/1990  . Smokeless tobacco: Former Systems developer    Types: Snuff    Quit date: 02/25/1990  . Alcohol use 0.0 oz/week  . Drug use: No  . Sexual activity: Not on file   Other Topics Concern  . Not on file   Social History Narrative  . No narrative on file     Vitals:   11/19/16 0811  BP: 134/82  Pulse: 64  SpO2: 98%  Weight: 186 lb (84.4 kg)  Height: 5\' 10"  (1.778 m)    PHYSICAL EXAM General: NAD HEENT: Normal. Neck: No JVD, no thyromegaly. Lungs: Clear to auscultation bilaterally with normal respiratory effort. CV: Nondisplaced PMI.  Regular rate and rhythm, normal S1/S2, no S3/S4, no murmur. No pretibial or periankle edema.  No carotid bruit.   Abdomen: Soft, nontender, no distention.  Neurologic: Alert and oriented.  Psych: Normal affect. Skin: Normal. Musculoskeletal: No gross deformities.    ECG: Most recent ECG reviewed.      ASSESSMENT AND PLAN: 1. CAD with inferior MI s/p PCI: Symptomatically stable. Continue ASA, metoprolol, and atorvastatin. Will dc Effient. Did not tolerate Imdur in the past as it caused headaches.  2. ICM EF 35-45% by cath, improved to 55%: He has not had any evidence of clinical CHF. He has not been on ACEI/ARB/spironolactone due to softer blood pressures in the past. Continue metoprolol.   3. Hyperlipidemia & hx of hypertriglyceridemia: Continue Lipitor 80 mg. Will check lipids.  Dispo: f/u 1 yr  Kate Sable, M.D., F.A.C.C.

## 2016-11-19 NOTE — Patient Instructions (Signed)
Medication Instructions:   Sean Paul current supply of your Effient, then STOP.  Continue all other medications.    Labwork: none  Testing/Procedures: none  Follow-Up: Your physician wants you to follow up in:  1 year.  You will receive a reminder letter in the mail one-two months in advance.  If you don't receive a letter, please call our office to schedule the follow up appointment   Any Other Special Instructions Will Be Listed Below (If Applicable).  If you need a refill on your cardiac medications before your next appointment, please call your pharmacy.

## 2016-11-29 ENCOUNTER — Encounter: Payer: Self-pay | Admitting: *Deleted

## 2016-11-29 ENCOUNTER — Other Ambulatory Visit: Payer: Self-pay | Admitting: *Deleted

## 2016-11-29 DIAGNOSIS — E781 Pure hyperglyceridemia: Secondary | ICD-10-CM

## 2017-04-02 DIAGNOSIS — Z1389 Encounter for screening for other disorder: Secondary | ICD-10-CM | POA: Diagnosis not present

## 2017-04-02 DIAGNOSIS — Z0000 Encounter for general adult medical examination without abnormal findings: Secondary | ICD-10-CM | POA: Diagnosis not present

## 2017-04-02 DIAGNOSIS — E748 Other specified disorders of carbohydrate metabolism: Secondary | ICD-10-CM | POA: Diagnosis not present

## 2017-04-02 DIAGNOSIS — M1A00X0 Idiopathic chronic gout, unspecified site, without tophus (tophi): Secondary | ICD-10-CM | POA: Diagnosis not present

## 2017-04-08 ENCOUNTER — Telehealth: Payer: Self-pay | Admitting: *Deleted

## 2017-04-08 NOTE — Telephone Encounter (Signed)
Notes recorded by Laurine Blazer, LPN on 0/04/1218 at 75:88 AM EDT Patient notified. Done by pmd. ------  Notes recorded by Herminio Commons, MD on 04/07/2017 at 9:46 AM EDT ok

## 2017-04-14 DIAGNOSIS — K7689 Other specified diseases of liver: Secondary | ICD-10-CM | POA: Diagnosis not present

## 2017-04-14 DIAGNOSIS — R7989 Other specified abnormal findings of blood chemistry: Secondary | ICD-10-CM | POA: Diagnosis not present

## 2017-04-14 DIAGNOSIS — R945 Abnormal results of liver function studies: Secondary | ICD-10-CM | POA: Diagnosis not present

## 2017-12-15 ENCOUNTER — Ambulatory Visit: Payer: 59 | Admitting: Cardiovascular Disease

## 2017-12-17 ENCOUNTER — Ambulatory Visit (INDEPENDENT_AMBULATORY_CARE_PROVIDER_SITE_OTHER): Payer: 59 | Admitting: Cardiovascular Disease

## 2017-12-17 ENCOUNTER — Other Ambulatory Visit: Payer: Self-pay

## 2017-12-17 ENCOUNTER — Encounter: Payer: Self-pay | Admitting: Cardiovascular Disease

## 2017-12-17 VITALS — BP 110/66 | HR 77 | Ht 70.0 in | Wt 183.6 lb

## 2017-12-17 DIAGNOSIS — E782 Mixed hyperlipidemia: Secondary | ICD-10-CM

## 2017-12-17 DIAGNOSIS — I252 Old myocardial infarction: Secondary | ICD-10-CM

## 2017-12-17 DIAGNOSIS — Z955 Presence of coronary angioplasty implant and graft: Secondary | ICD-10-CM | POA: Diagnosis not present

## 2017-12-17 DIAGNOSIS — I255 Ischemic cardiomyopathy: Secondary | ICD-10-CM

## 2017-12-17 DIAGNOSIS — I25118 Atherosclerotic heart disease of native coronary artery with other forms of angina pectoris: Secondary | ICD-10-CM | POA: Diagnosis not present

## 2017-12-17 NOTE — Progress Notes (Signed)
SUBJECTIVE: The patient presents for routine follow up. He has a h/o coronary artery disease and sustained an inferior myocardial infarction in August 2016 with an associated ischemic cardiomyopathy.   Echocardiogram on 06/23/15 demonstrated a marked improvement in left ventricular systolic function, LVEF 11% with hypokinesis of the basal-mid inferior myocardium with associated grade 1 diastolic dysfunction. There was no significant valvular regurgitation.  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  ECG performed today which I personally reviewed demonstrates sinus rhythm with old inferior infarct and nonspecific intraventricular conduction delay.  The patient denies any symptoms of chest pain, palpitations, shortness of breath, lightheadedness, dizziness, leg swelling, orthopnea, PND, and syncope.    Review of Systems: As per "subjective", otherwise negative.  Allergies  Allergen Reactions  . Penicillins Itching and Swelling    Current Outpatient Medications  Medication Sig Dispense Refill  . aspirin EC 81 MG tablet Take 1 tablet (81 mg total) by mouth daily. 30 tablet 11  . atorvastatin (LIPITOR) 80 MG tablet TAKE (1) TABLET BY MOUTH AT BEDTIME. 90 tablet 3  . Coenzyme Q10 (COQ-10) 100 MG CAPS Take 1 capsule by mouth daily.    [name] Oil 500 MG CAPS Take 1 capsule by mouth every morning.    . metoprolol tartrate (LOPRESSOR) 25 MG tablet TAKE (1) TABLET BY MOUTH TWICE DAILY. 180 tablet 3  . NIASPAN 1000 MG CR tablet Take 1 tablet (1,000 mg total) by mouth daily. 90 tablet 3  . nitroGLYCERIN (NITROSTAT) 0.4 MG SL tablet Place 1 tablet (0.4 mg total) under the tongue every 5 (five) minutes as needed for chest pain (up to 3 doses). 25 tablet 3  . Omega-3 Fatty Acids (FISH OIL) 1200 MG CAPS Take 1 capsule by mouth every evening.     No current facility-administered medications for this visit.      Past Medical History:  Diagnosis Date  . CAD (coronary artery disease)    a. 05/2015: inferior MI/newly diagnosed CAD s/p DES to [facility] after thrombectomy with moderate residual LAD disease.  Marland Kitchen History of tobacco abuse   . Hyperlipidemia   . Hypertriglyceridemia   . Ischemic cardiomyopathy    a. 05/2015: LVEF 35-45% by cath 05/2015.  Marland Kitchen Neuromuscular disorder ([place])    prior cervical fusion  . Sinus bradycardia    a. Limiting BB titration during 05/2015 admission.    Past Surgical History:  Procedure Laterality Date  . CARDIAC CATHETERIZATION N/A 06/07/2015   Procedure: Left Heart Cath and Coronary Angiography;  Surgeon: Jettie Booze, MD;  Location: [place] [facility];  Service: Cardiovascular;  Laterality: N/A;  . CARDIAC CATHETERIZATION N/A 06/09/2015   Procedure: Left Heart Cath and Coronary Angiography;  Surgeon: Jettie Booze, MD;  Location: [place] [facility];  Service: Cardiovascular;  Laterality: N/A;  . CARDIAC CATHETERIZATION  06/09/2015   Procedure: Coronary Stent Intervention;  Surgeon: Jettie Booze, MD;  Location: [place] [facility];  Service: Cardiovascular;;  . CERVICAL SPINE SURGERY  12/25/2010  . COLONOSCOPY  06/17/2011   Procedure: COLONOSCOPY;  Surgeon: Daneil Dolin, MD;  [facility];  Service: Endoscopy;  Laterality: N/A;  10:15 AM  . PERIPHERAL VASCULAR CATHETERIZATION  06/09/2015   Procedure: Thrombectomy;  Surgeon: Jettie Booze, MD;  Location: [place] [facility];  Service: Cardiovascular;;    Social History   Socioeconomic History  . Marital status: Single  Spouse name: Not on file  . Number of children: Not on file  . Years of education: Not on file  . Highest education level: Not on file  Social Needs  . Financial resource strain: Not on file  . Food insecurity - worry: Not on file  . Food insecurity - inability: Not on file  . Transportation needs - medical: Not on file  . Transportation needs - non-medical: Not  on file  Occupational History  . Not on file  Tobacco Use  . Smoking status: Former Smoker    Packs/day: 1.00    Years: 17.00    Pack years: 17.00    Types: Cigarettes    Start date: 05/09/1973    Last attempt to quit: 02/25/1990    Years since quitting: 27.8  . Smokeless tobacco: Former Systems developer    Types: Snuff    Quit date: 02/25/1990  Substance and Sexual Activity  . Alcohol use: Yes    Alcohol/week: 0.0 oz  . Drug use: No  . Sexual activity: Not on file  Other Topics Concern  . Not on file  Social History Narrative  . Not on file     Vitals:   12/17/17 1440  BP: 110/66  Pulse: 77  Weight: 183 lb 9.6 oz (83.3 kg)  Height: 5\' 10"  (1.778 m)    Wt Readings from Last 3 Encounters:  12/17/17 183 lb 9.6 oz (83.3 kg)  11/19/16 186 lb (84.4 kg)  05/17/16 178 lb (80.7 kg)     PHYSICAL EXAM General: NAD HEENT: Normal. Neck: No JVD, no thyromegaly. Lungs: Clear to auscultation bilaterally with normal respiratory effort. CV: Regular rate and rhythm, normal S1/S2, no S3/S4, no murmur. No pretibial or periankle edema.  No carotid bruit.   Abdomen: Soft, nontender, no distention.  Neurologic: Alert and oriented.  Psych: Normal affect. Skin: Normal. Musculoskeletal: No gross deformities.    ECG: Most recent ECG reviewed.   Labs: Lab Results  Component Value Date/Time   K 3.6 06/10/2015 03:40 AM   BUN 5 (L) 06/10/2015 03:40 AM   CREATININE 0.68 06/10/2015 03:40 AM   ALT 36 08/18/2015 09:54 AM   TSH 1.076 06/07/2015 07:58 AM   HGB 12.4 (L) 06/10/2015 03:40 AM     Lipids: Lab Results  Component Value Date/Time   LDLCALC 36 08/18/2015 09:54 AM   CHOL 77 (L) 08/18/2015 09:54 AM   TRIG 67 08/18/2015 09:54 AM   HDL 28 (L) 08/18/2015 09:54 AM       ASSESSMENT AND PLAN: 1. CAD with inferior MI s/p PCI: Symptomatically stable. Continue ASA, metoprolol, and atorvastatin. Did not tolerate Imdur in the past as it caused headaches.  2. ICM EF 35-45% by cath, improved  to 55%: He has not had any evidence of clinical CHF. He has not been on ACEI/ARB/spironolactone due to softer blood pressures in the past. Continue metoprolol.   3.  Dyslipidemia: LDL 40 and at goal on 04/02/17.  Continue Lipitor 80 mg.      Disposition: Follow up 1 year.   Kate Sable, M.D., F.A.C.C.

## 2017-12-17 NOTE — Patient Instructions (Signed)

## 2017-12-26 DIAGNOSIS — E663 Overweight: Secondary | ICD-10-CM | POA: Diagnosis not present

## 2017-12-26 DIAGNOSIS — I10 Essential (primary) hypertension: Secondary | ICD-10-CM | POA: Diagnosis not present

## 2017-12-26 DIAGNOSIS — I214 Non-ST elevation (NSTEMI) myocardial infarction: Secondary | ICD-10-CM | POA: Diagnosis not present

## 2017-12-26 DIAGNOSIS — M10072 Idiopathic gout, left ankle and foot: Secondary | ICD-10-CM | POA: Diagnosis not present

## 2018-06-15 DIAGNOSIS — E782 Mixed hyperlipidemia: Secondary | ICD-10-CM | POA: Diagnosis not present

## 2018-06-15 DIAGNOSIS — I10 Essential (primary) hypertension: Secondary | ICD-10-CM | POA: Diagnosis not present

## 2018-06-15 DIAGNOSIS — Z0000 Encounter for general adult medical examination without abnormal findings: Secondary | ICD-10-CM | POA: Diagnosis not present

## 2018-06-15 DIAGNOSIS — Z1389 Encounter for screening for other disorder: Secondary | ICD-10-CM | POA: Diagnosis not present

## 2018-06-15 DIAGNOSIS — R7301 Impaired fasting glucose: Secondary | ICD-10-CM | POA: Diagnosis not present

## 2018-07-16 DIAGNOSIS — R972 Elevated prostate specific antigen [PSA]: Secondary | ICD-10-CM | POA: Diagnosis not present

## 2018-07-16 DIAGNOSIS — E349 Endocrine disorder, unspecified: Secondary | ICD-10-CM | POA: Diagnosis not present

## 2018-08-25 DIAGNOSIS — R972 Elevated prostate specific antigen [PSA]: Secondary | ICD-10-CM | POA: Diagnosis not present

## 2018-09-10 DIAGNOSIS — C61 Malignant neoplasm of prostate: Secondary | ICD-10-CM | POA: Diagnosis not present

## 2018-09-30 ENCOUNTER — Ambulatory Visit: Payer: 59

## 2018-09-30 ENCOUNTER — Ambulatory Visit: Payer: 59 | Admitting: Radiation Oncology

## 2018-10-06 DIAGNOSIS — C61 Malignant neoplasm of prostate: Secondary | ICD-10-CM | POA: Insufficient documentation

## 2018-10-06 NOTE — Progress Notes (Signed)
GU Location of Tumor / Histology: prostatic adenocarcinoma  If Prostate Cancer, Gleason Score is (3 + 4) and PSA is (5.5) on 06/2018. Prostate volume: 2.   Odis Hollingshead was referred by Dr. Gerarda Fraction to Dr. Alexis Frock for further evaluation of an elevated PSA s/p exogenous androgens, now stopped.   Biopsies of prostate (if applicable) revealed:    Past/Anticipated interventions by urology, if any: prostate biopsy, offered and encouraged rad onc opinion  Past/Anticipated interventions by medical oncology, if any: no  Weight changes, if any: no  Bowel/Bladder complaints, if any: none to report  Nausea/Vomiting, if any: no  Pain issues, if any:  intermittent stomach discomfort without any clear triggers/irritants   SAFETY ISSUES:  Prior radiation? no  Pacemaker/ICD? no  Possible current pregnancy? no  Is the patient on methotrexate? no  Current Complaints / other details:  62 year old male. Divorced with three sons. Works in Estate manager/land agent. Former smoker for 18 years. No family hx of prostate ca. Presents today for his initial radiation consult. Denies any new symptoms or concerns since his last doctors appointment.

## 2018-10-07 ENCOUNTER — Other Ambulatory Visit: Payer: Self-pay

## 2018-10-07 ENCOUNTER — Encounter: Payer: Self-pay | Admitting: Radiation Oncology

## 2018-10-07 ENCOUNTER — Ambulatory Visit
Admission: RE | Admit: 2018-10-07 | Discharge: 2018-10-07 | Disposition: A | Discharge status: Home / Self Care | Payer: 59 | Source: Ambulatory Visit | Attending: Radiation Oncology | Admitting: Radiation Oncology

## 2018-10-07 DIAGNOSIS — R972 Elevated prostate specific antigen [PSA]: Secondary | ICD-10-CM | POA: Diagnosis not present

## 2018-10-07 DIAGNOSIS — C61 Malignant neoplasm of prostate: Secondary | ICD-10-CM

## 2018-10-07 NOTE — Progress Notes (Signed)
Radiation Oncology         [phone] ________________________________  Initial outpatient Consultation  Name: Sean Paul MRN: [id number]  Date: 10/07/2018  DOB: 1956/02/10  CC:Redmond School, MD  Alexis Frock, MD   REFERRING PHYSICIAN: Alexis Frock, MD  DIAGNOSIS: 62 y.o. gentleman with Stage T2a adenocarcinoma of the prostate with Gleason score of 3+4, and PSA of 5.5.    ICD-10-CM   1. Malignant neoplasm of prostate [facility] [place] [facility]ulatory referral to Social Work    HISTORY OF PRESENT ILLNESS: Sean Paul is a 62 y.o. male with a diagnosis of prostate cancer. He was noted to have an elevated PSA of 5.5 by his primary care physician, Dr. Gerarda Fraction.  He was previously on exogenous androgens, stopped around 2015.  Prior PSA readings have been at 1.8 in 2013, 2.5 in 2015 and 3.7 in 2018.  Accordingly, he was referred for evaluation in urology by Dr. Tresa Moore on 07/16/18, digital rectal examination was performed at that time revealing right base lateral asymmetry/induration that is non-fixed.  The patient proceeded to transrectal ultrasound with 12 biopsies of the prostate on 08/25/18.  The prostate volume measured 34 cc.  Out of 12 core biopsies, 3 were positive.  The maximum Gleason score was 3+4, and this was seen in right base lateral and right mid lateral.  Additionally, Gleason 3+3 was seen in left base.  The patient reviewed the biopsy results with his urologist and he has kindly been referred today for discussion of potential radiation treatment options.   PREVIOUS RADIATION THERAPY: No  PAST MEDICAL HISTORY:  Past Medical History:  Diagnosis Date  . CAD (coronary artery disease)    a. 05/2015: inferior MI/newly diagnosed CAD s/p DES to [facility] after thrombectomy with moderate residual LAD disease.  Marland Kitchen History of tobacco abuse   . Hyperlipidemia   . Hypertriglyceridemia   . Ischemic cardiomyopathy    a. 05/2015: LVEF 35-45% by cath 05/2015.  Marland Kitchen Neuromuscular disorder  ([place])    prior cervical fusion  . Sinus bradycardia    a. Limiting BB titration during 05/2015 admission.      PAST SURGICAL HISTORY: Past Surgical History:  Procedure Laterality Date  . CARDIAC CATHETERIZATION N/A 06/07/2015   Procedure: Left Heart Cath and Coronary Angiography;  Surgeon: Jettie Booze, MD;  Location: [place] [facility] LAB;  Service: Cardiovascular;  Laterality: N/A;  . CARDIAC CATHETERIZATION N/A 06/09/2015   Procedure: Left Heart Cath and Coronary Angiography;  Surgeon: Jettie Booze, MD;  Location: [place] [facility];  Service: Cardiovascular;  Laterality: N/A;  . CARDIAC CATHETERIZATION  06/09/2015   Procedure: Coronary Stent Intervention;  Surgeon: Jettie Booze, MD;  Location: [place] [facility];  Service: Cardiovascular;;  . CERVICAL SPINE SURGERY  12/25/2010  . COLONOSCOPY  06/17/2011   Procedure: COLONOSCOPY;  Surgeon: Daneil Dolin, MD;  [facility];  Service: Endoscopy;  Laterality: N/A;  10:15 AM  . PERIPHERAL VASCULAR CATHETERIZATION  06/09/2015   Procedure: Thrombectomy;  Surgeon: Jettie Booze, MD;  Location: [place] [facility];  Service: Cardiovascular;;    FAMILY HISTORY:  Family History  Problem Relation Age of Onset  . Heart disease Mother 32       CABG/ LUNG CANCER  . Suicidality Father 4       SHOT HIS SELF  . Cancer - Lung Brother 69  . Heart attack Brother 71    SOCIAL HISTORY:  Social History   Socioeconomic History  .  Marital status: Single    Spouse name: Not on file  . Number of children: Not on file  . Years of education: Not on file  . Highest education level: Not on file  Occupational History  . Not on file  Social Needs  . Financial resource strain: Not on file  . Food insecurity:    Worry: Not on file    Inability: Not on file  . Transportation needs:    Medical: Not on file    Non-medical: Not on file  Tobacco Use  . Smoking status: Former Smoker    Packs/day: 1.00    Years: 17.00     Pack years: 17.00    Types: Cigarettes    Start date: 05/09/1973    Last attempt to quit: 02/25/1990    Years since quitting: 28.6  . Smokeless tobacco: Former Systems developer    Types: Snuff    Quit date: 02/25/1990  Substance and Sexual Activity  . Alcohol use: Yes    Alcohol/week: 0.0 standard drinks  . Drug use: No  . Sexual activity: Not on file  Lifestyle  . Physical activity:    Days per week: Not on file    Minutes per session: Not on file  . Stress: Not on file  Relationships  . Social connections:    Talks on phone: Not on file    Gets together: Not on file    Attends religious service: Not on file    Active member of club or organization: Not on file    Attends meetings of clubs or organizations: Not on file    Relationship status: Not on file  . Intimate partner violence:    Fear of current or ex partner: Not on file    Emotionally abused: Not on file    Physically abused: Not on file    Forced sexual activity: Not on file  Other Topics Concern  . Not on file  Social History Narrative  . Not on file    ALLERGIES: Penicillins  MEDICATIONS:  Current Outpatient Medications  Medication Sig Dispense Refill  . aspirin EC 81 MG tablet Take 1 tablet (81 mg total) by mouth daily. 30 tablet 11  . atorvastatin (LIPITOR) 80 MG tablet TAKE (1) TABLET BY MOUTH AT BEDTIME. 90 tablet 3  . Coenzyme Q10 (COQ-10) 100 MG CAPS Take 1 capsule by mouth daily.    [name] Oil 500 MG CAPS Take 1 capsule by mouth every morning.    . metoprolol tartrate (LOPRESSOR) 25 MG tablet TAKE (1) TABLET BY MOUTH TWICE DAILY. 180 tablet 3  . NIASPAN 1000 MG CR tablet Take 1 tablet (1,000 mg total) by mouth daily. 90 tablet 3  . Omega-3 Fatty Acids (FISH OIL) 1200 MG CAPS Take 1 capsule by mouth every evening.    . nitroGLYCERIN (NITROSTAT) 0.4 MG SL tablet Place 1 tablet (0.4 mg total) under the tongue every 5 (five) minutes as needed for chest pain (up to 3 doses). (Patient not taking: Reported on 10/07/2018)  25 tablet 3   No current facility-administered medications for this encounter.     REVIEW OF SYSTEMS:  On review of systems, the patient reports that he is doing well overall. He denies any chest pain, shortness of breath, cough, fevers, chills, night sweats, unintended weight changes. He denies any bowel disturbances, and denies nausea or vomiting. He reports intermittent stomach discomfort without any clear triggers/irritants. He denies any new musculoskeletal or joint aches or pains. His IPSS was  7, indicating mild urinary symptoms. His SHIM was 20, indicating he has mild erectile dysfunction. A complete review of systems is obtained and is otherwise negative. He is accompanied by his sister today.    PHYSICAL EXAM:  Wt Readings from Last 3 Encounters:  10/07/18 177 lb 8 oz (80.5 kg)  12/17/17 183 lb 9.6 oz (83.3 kg)  11/19/16 186 lb (84.4 kg)   Temp Readings from Last 3 Encounters:  10/07/18 98.4 F (36.9 C) (Oral)  06/10/15 98 F (36.7 C) (Oral)  06/17/11 97.8 F (36.6 C) (Oral)   BP Readings from Last 3 Encounters:  10/07/18 (!) 137/91  12/17/17 110/66  11/19/16 134/82   Pulse Readings from Last 3 Encounters:  10/07/18 72  12/17/17 77  11/19/16 64    /10  In general this is a well appearing Caucasian gentleman in no acute distress. He is alert and oriented x4 and appropriate throughout the examination. HEENT reveals that the patient is normocephalic, atraumatic. EOMs are intact. PERRLA. Skin is intact without any evidence of gross lesions. Cardiovascular exam reveals a regular rate and rhythm, no clicks rubs or murmurs are auscultated. Chest is clear to auscultation bilaterally. Lymphatic assessment is performed and does not reveal any adenopathy in the cervical, supraclavicular, axillary, or inguinal chains. Abdomen has active bowel sounds in all quadrants and is intact. The abdomen is soft, non tender, non distended. Lower extremities are negative for pretibial pitting  edema, deep calf tenderness, cyanosis or clubbing.   KPS = 100  100 - Normal; no complaints; no evidence of disease. 90   - Able to carry on normal activity; minor signs or symptoms of disease. 80   - Normal activity with effort; some signs or symptoms of disease. 33   - Cares for self; unable to carry on normal activity or to do active work. 60   - Requires occasional assistance, but is able to care for most of his personal needs. 50   - Requires considerable assistance and frequent medical care. 24   - Disabled; requires special care and assistance. 40   - Severely disabled; hospital admission is indicated although death not imminent. 38   - Very sick; hospital admission necessary; active supportive treatment necessary. 10   - Moribund; fatal processes progressing rapidly. 0     - Dead  Karnofsky DA, [employer], Craver LS and Burchenal [facility] [phone]) The use of the nitrogen mustards in the palliative treatment of carcinoma: with particular reference to bronchogenic carcinoma Cancer 1 634-56  LABORATORY DATA:  Lab Results  Component Value Date   WBC 6.3 06/10/2015   HGB 12.4 (L) 06/10/2015   HCT 36.0 (L) 06/10/2015   MCV 92.8 06/10/2015   PLT 217 06/10/2015   Lab Results  Component Value Date   NA 137 06/10/2015   K 3.6 06/10/2015   CL 106 06/10/2015   CO2 24 06/10/2015   Lab Results  Component Value Date   ALT 36 08/18/2015   AST 28 08/18/2015   ALKPHOS 58 08/18/2015   BILITOT 1.1 08/18/2015     RADIOGRAPHY: No results found.    IMPRESSION/PLAN: 1. 62 y.o. gentleman with Stage T2a adenocarcinoma of the prostate with Gleason score of 3+4, and PSA of 5.5. We discussed the patient's workup and outlined the nature of prostate cancer in this setting. The patient's T stage, Gleason's score, and PSA put him into the favorable intermediate risk group. Accordingly, he is eligible for a variety of potential treatment options including brachytherapy,  5.5 weeks of external radiation  or prostatectomy. We discussed the available radiation techniques, and focused on the details and logistics and delivery. We discussed and outlined the risks, benefits, short and long-term effects associated with radiotherapy and compared and contrasted these with prostatectomy. We discussed the role of SpaceOAR in reducing the rectal toxicity associated with radiotherapy.   At the end of the conversation the patient is interested in moving forward with brachytherapy and use of SpaceOAR to reduce rectal toxicity from radiotherapy.  We will share our discussion with Dr. Tresa Moore and move forward with scheduling his CT [facility] planning appointment in the near future.  The patient met briefly with Romie Jumper in our office who will be working closely with him to coordinate OR scheduling and pre and post procedure appointments.  We will contact the pharmaceutical rep to ensure that [place] is available at the time of procedure.  He will have a prostate MRI following his post-seed CT SIM to confirm appropriate distribution of the [place].  We spent 60 minutes face to face with the patient and more than 50% of that time was spent in counseling and/or coordination of care.    [name], PA-C    Tyler Pita, MD  [facility] Direct Dial: [phone]  Fax: [phone] [email or web address]  Skype  [email or web address]  This document serves as a record of services personally performed by Tyler Pita, MD and Freeman Caldron, PA-C. It was created on their behalf by Wilburn Mylar, a trained medical scribe. The creation of this record is based on the scribe's personal observations and the provider's statements to them. This document has been checked and approved by the attending provider.

## 2018-10-08 ENCOUNTER — Encounter: Payer: Self-pay | Admitting: General Practice

## 2018-10-08 NOTE — Progress Notes (Signed)
[facility] Psychosocial Distress Screening Clinical Social Work  Clinical Social Work was referred by distress screening protocol.  The patient scored a 5 on the Psychosocial Distress Thermometer which indicates moderate distress. Clinical Social Worker contacted patient by phone to assess for distress and other psychosocial needs. Patient at work, not good time to interrupt, will mail information packet on [place] and contact information so patient can access as needed.   ONCBCN DISTRESS SCREENING 10/07/2018  Screening Type Initial Screening  Distress experienced in past week (1-10) 5  Emotional problem type Nervousness/Anxiety;Adjusting to illness  Information Concerns Type Lack of info about diagnosis;Lack of info about treatment  Physical Problem type Sleep/insomnia  Physician notified of physical symptoms No  Referral to clinical psychology No  Referral to clinical social work Yes  Referral to dietition No  Referral to financial advocate No  Referral to support programs No  Referral to palliative care No    Clinical Social Worker follow up needed: No.  If yes, follow up plan:  Beverely Pace, [place], LCSW Clinical Social Worker Phone:  [phone]

## 2018-10-12 ENCOUNTER — Telehealth: Payer: Self-pay | Admitting: *Deleted

## 2018-10-12 NOTE — Telephone Encounter (Signed)
CALLED PATIENT TO INFORM OF PRE-SEED PLANNING CT, SPOKE WITH PATIENT AND HE IS AWARE OF THIS APPT.

## 2018-10-30 ENCOUNTER — Other Ambulatory Visit: Payer: Self-pay | Admitting: Urology

## 2018-10-30 DIAGNOSIS — E349 Endocrine disorder, unspecified: Secondary | ICD-10-CM | POA: Diagnosis not present

## 2018-10-30 DIAGNOSIS — C61 Malignant neoplasm of prostate: Secondary | ICD-10-CM | POA: Diagnosis not present

## 2018-11-05 ENCOUNTER — Telehealth: Payer: Self-pay | Admitting: *Deleted

## 2018-11-05 NOTE — Telephone Encounter (Signed)
Called patient to remind of pre-seed appts. and chest x-ray and EKG for 11-06-18, spoke with patient and he is aware of these appts.

## 2018-11-06 ENCOUNTER — Ambulatory Visit
Admission: RE | Admit: 2018-11-06 | Discharge: 2018-11-06 | Disposition: A | Discharge status: Home / Self Care | Payer: 59 | Source: Ambulatory Visit | Attending: Urology | Admitting: Urology

## 2018-11-06 ENCOUNTER — Telehealth: Payer: Self-pay | Admitting: *Deleted

## 2018-11-06 ENCOUNTER — Ambulatory Visit
Admission: RE | Admit: 2018-11-06 | Discharge: 2018-11-06 | Disposition: A | Discharge status: Home / Self Care | Payer: 59 | Source: Ambulatory Visit | Attending: Radiation Oncology | Admitting: Radiation Oncology

## 2018-11-06 ENCOUNTER — Encounter (HOSPITAL_COMMUNITY)
Admission: RE | Admit: 2018-11-06 | Discharge: 2018-11-06 | Disposition: A | Discharge status: Home / Self Care | Payer: 59 | Source: Ambulatory Visit | Attending: Urology | Admitting: Urology

## 2018-11-06 ENCOUNTER — Ambulatory Visit (HOSPITAL_COMMUNITY)
Admission: RE | Admit: 2018-11-06 | Discharge: 2018-11-06 | Disposition: A | Discharge status: Home / Self Care | Payer: 59 | Source: Ambulatory Visit | Attending: Urology | Admitting: Urology

## 2018-11-06 DIAGNOSIS — Z510 Encounter for antineoplastic radiation therapy: Secondary | ICD-10-CM | POA: Diagnosis not present

## 2018-11-06 DIAGNOSIS — Z01818 Encounter for other preprocedural examination: Secondary | ICD-10-CM | POA: Insufficient documentation

## 2018-11-06 DIAGNOSIS — C61 Malignant neoplasm of prostate: Secondary | ICD-10-CM | POA: Diagnosis not present

## 2018-11-06 NOTE — Progress Notes (Signed)
  Radiation Oncology         ([phone]) [phone] ________________________________  Name: Sean Paul MRN: [id number]  Date: 11/06/2018  DOB: Jun 01, 1956  SIMULATION AND TREATMENT PLANNING NOTE PUBIC ARCH STUDY  VO:PFYTW, Purcell Nails, MD  Redmond School, MD  DIAGNOSIS: 63 y.o. gentleman with Stage T2a adenocarcinoma of the prostate with Gleason score of 3+4, and PSA of 5.5.     ICD-10-CM   1. Malignant neoplasm of prostate ([place]) C61     COMPLEX SIMULATION:  The patient presented today for evaluation for possible prostate seed implant. He was brought to the radiation planning suite and placed supine on the CT couch. A 3-dimensional image study set was obtained in upload to the planning computer. There, on each axial slice, I contoured the prostate gland. Then, using three-dimensional radiation planning tools I reconstructed the prostate in view of the structures from the transperineal needle pathway to assess for possible pubic arch interference. In doing so, I did not appreciate any pubic arch interference. Also, the patient's prostate volume was estimated based on the drawn structure. The volume was 35 cc.  Given the pubic arch appearance and prostate volume, patient remains a good candidate to proceed with prostate seed implant. Today, he freely provided informed written consent to proceed.    PLAN: The patient will undergo prostate seed implant.   ________________________________  Sheral Apley. Tammi Klippel, M.D.   This document serves as a record of services personally performed by Tyler Pita, MD. It was created on his behalf by Wilburn Mylar, a trained medical scribe. The creation of this record is based on the scribe's personal observations and the provider's statements to them. This document has been checked and approved by the attending provider.

## 2018-11-06 NOTE — Telephone Encounter (Signed)
CALLED PATIENT TO INFORM OF LAB APPT. FOR 12-11-18- ARRIVAL TIME - 7:45 AM @ [facility] ADMITTING, LVM FOR A RETURN CALL

## 2018-11-12 ENCOUNTER — Other Ambulatory Visit: Payer: Self-pay | Admitting: Urology

## 2018-11-12 DIAGNOSIS — C61 Malignant neoplasm of prostate: Secondary | ICD-10-CM

## 2018-12-08 ENCOUNTER — Telehealth: Payer: Self-pay | Admitting: *Deleted

## 2018-12-08 NOTE — Telephone Encounter (Signed)
Returned patient's phone call, lvm for a return call 

## 2018-12-10 ENCOUNTER — Telehealth: Payer: Self-pay | Admitting: *Deleted

## 2018-12-10 NOTE — Telephone Encounter (Signed)
Called patient to remind of lab appt. for 12-11-18 - arrival time - 7:30 am @ [facility] Admitting, lvm for a return call

## 2018-12-11 ENCOUNTER — Encounter (INDEPENDENT_AMBULATORY_CARE_PROVIDER_SITE_OTHER): Payer: Self-pay

## 2018-12-11 ENCOUNTER — Encounter (HOSPITAL_COMMUNITY)
Admission: RE | Admit: 2018-12-11 | Discharge: 2018-12-11 | Disposition: A | Discharge status: Home / Self Care | Payer: 59 | Source: Ambulatory Visit | Attending: Urology | Admitting: Urology

## 2018-12-11 DIAGNOSIS — Z01812 Encounter for preprocedural laboratory examination: Secondary | ICD-10-CM | POA: Diagnosis not present

## 2018-12-11 DIAGNOSIS — R8271 Bacteriuria: Secondary | ICD-10-CM | POA: Diagnosis not present

## 2018-12-11 DIAGNOSIS — C61 Malignant neoplasm of prostate: Secondary | ICD-10-CM | POA: Diagnosis not present

## 2018-12-11 LAB — COMPREHENSIVE METABOLIC PANEL
ALT: 49 U/L — ABNORMAL HIGH (ref 0–44)
AST: 39 U/L (ref 15–41)
Albumin: 4.9 g/dL (ref 3.5–5.0)
Alkaline Phosphatase: 68 U/L (ref 38–126)
Anion gap: 6 (ref 5–15)
BUN: 14 mg/dL (ref 8–23)
CHLORIDE: 105 mmol/L (ref 98–111)
CO2: 26 mmol/L (ref 22–32)
Calcium: 9.5 mg/dL (ref 8.9–10.3)
Creatinine, Ser: 0.88 mg/dL (ref 0.61–1.24)
GFR calc Af Amer: >60 mL/min (ref >60)
GFR calc non Af Amer: >60 mL/min (ref >60)
Glucose, Bld: 121 mg/dL — ABNORMAL HIGH (ref 70–99)
POTASSIUM: 5 mmol/L (ref 3.5–5.1)
SODIUM: 137 mmol/L (ref 135–145)
Total Bilirubin: 1.3 mg/dL — ABNORMAL HIGH (ref 0.3–1.2)
Total Protein: 7.9 g/dL (ref 6.5–8.1)

## 2018-12-11 LAB — CBC
HCT: 45.5 % (ref 39.0–52.0)
Hemoglobin: 15.1 g/dL (ref 13.0–17.0)
MCH: 32.1 pg (ref 26.0–34.0)
MCHC: 33.2 g/dL (ref 30.0–36.0)
MCV: 96.8 fL (ref 80.0–100.0)
PLATELETS: 211 10*3/uL (ref 150–400)
RBC: 4.7 MIL/uL (ref 4.22–5.81)
RDW: 11.8 % (ref 11.5–15.5)
WBC: 5 10*3/uL (ref 4.0–10.5)
nRBC: 0 % (ref 0.0–0.2)

## 2018-12-11 LAB — APTT: aPTT: 28 seconds (ref 24–36)

## 2018-12-11 LAB — PROTIME-INR
INR: 0.97
Prothrombin Time: 12.8 seconds (ref 11.4–15.2)

## 2018-12-15 ENCOUNTER — Other Ambulatory Visit: Payer: Self-pay

## 2018-12-15 ENCOUNTER — Encounter (HOSPITAL_BASED_OUTPATIENT_CLINIC_OR_DEPARTMENT_OTHER): Payer: Self-pay | Admitting: *Deleted

## 2018-12-15 NOTE — Progress Notes (Signed)
PCP:  CARDIOLOGIST: dr Natividad Brood 12-17-17 epic  INFO IN Epic:, labs cbc, cmet, pt, ptt done 12-11-17, ekg and chest xray done 11-06-2018   INFO ON CHART:  BLOOD THINNERS AND LAST DOSES: none ____________________________________  PATIENT SYMPTOMS AT TIME OF PREOP:

## 2018-12-15 NOTE — Progress Notes (Signed)
Spoke with Baden Npo after midnight food, clear liquids until 700 am, then npo, arrive 1100 am 12-18-2018 [facility] meds to take: metoprolol tartrtae, fleets enema am 12-18-2018 Records on chart/epic: labs cbc, cmet, pt, ptt done 12-11-2018, ekg and chest xray done 11-06-2018, lov dr Bronson Ing cardiology 12-17-17 Has surgery orders in epic Driver sister susan elzey

## 2018-12-17 ENCOUNTER — Telehealth: Payer: Self-pay | Admitting: *Deleted

## 2018-12-17 NOTE — Telephone Encounter (Signed)
CALLED PATIENT TO REMIND OF PROCEDURE FOR 12-18-18, LVM FOR A RETURN CALL

## 2018-12-18 ENCOUNTER — Ambulatory Visit (HOSPITAL_BASED_OUTPATIENT_CLINIC_OR_DEPARTMENT_OTHER): Payer: 59 | Admitting: Physician Assistant

## 2018-12-18 ENCOUNTER — Ambulatory Visit (HOSPITAL_BASED_OUTPATIENT_CLINIC_OR_DEPARTMENT_OTHER): Payer: 59 | Admitting: Anesthesiology

## 2018-12-18 ENCOUNTER — Encounter (HOSPITAL_BASED_OUTPATIENT_CLINIC_OR_DEPARTMENT_OTHER): Payer: Self-pay

## 2018-12-18 ENCOUNTER — Ambulatory Visit (HOSPITAL_BASED_OUTPATIENT_CLINIC_OR_DEPARTMENT_OTHER)
Admission: RE | Admit: 2018-12-18 | Discharge: 2018-12-18 | Disposition: A | Discharge status: Home / Self Care | Payer: 59 | Source: Other Acute Inpatient Hospital | Attending: Urology | Admitting: Urology

## 2018-12-18 ENCOUNTER — Encounter (HOSPITAL_BASED_OUTPATIENT_CLINIC_OR_DEPARTMENT_OTHER)
Admission: RE | Disposition: A | Discharge status: Home / Self Care | Payer: Self-pay | Source: Other Acute Inpatient Hospital | Attending: Urology

## 2018-12-18 ENCOUNTER — Ambulatory Visit (HOSPITAL_COMMUNITY): Payer: 59

## 2018-12-18 DIAGNOSIS — I252 Old myocardial infarction: Secondary | ICD-10-CM | POA: Insufficient documentation

## 2018-12-18 DIAGNOSIS — Z01818 Encounter for other preprocedural examination: Secondary | ICD-10-CM

## 2018-12-18 DIAGNOSIS — Z87891 Personal history of nicotine dependence: Secondary | ICD-10-CM | POA: Insufficient documentation

## 2018-12-18 DIAGNOSIS — E781 Pure hyperglyceridemia: Secondary | ICD-10-CM | POA: Insufficient documentation

## 2018-12-18 DIAGNOSIS — Z955 Presence of coronary angioplasty implant and graft: Secondary | ICD-10-CM | POA: Diagnosis not present

## 2018-12-18 DIAGNOSIS — C61 Malignant neoplasm of prostate: Secondary | ICD-10-CM | POA: Insufficient documentation

## 2018-12-18 DIAGNOSIS — Z801 Family history of malignant neoplasm of trachea, bronchus and lung: Secondary | ICD-10-CM | POA: Diagnosis not present

## 2018-12-18 DIAGNOSIS — R001 Bradycardia, unspecified: Secondary | ICD-10-CM | POA: Diagnosis not present

## 2018-12-18 DIAGNOSIS — Z8249 Family history of ischemic heart disease and other diseases of the circulatory system: Secondary | ICD-10-CM | POA: Insufficient documentation

## 2018-12-18 DIAGNOSIS — I10 Essential (primary) hypertension: Secondary | ICD-10-CM | POA: Diagnosis not present

## 2018-12-18 DIAGNOSIS — I509 Heart failure, unspecified: Secondary | ICD-10-CM | POA: Diagnosis not present

## 2018-12-18 DIAGNOSIS — Z981 Arthrodesis status: Secondary | ICD-10-CM | POA: Diagnosis not present

## 2018-12-18 DIAGNOSIS — E785 Hyperlipidemia, unspecified: Secondary | ICD-10-CM | POA: Insufficient documentation

## 2018-12-18 DIAGNOSIS — I255 Ischemic cardiomyopathy: Secondary | ICD-10-CM | POA: Diagnosis not present

## 2018-12-18 DIAGNOSIS — Z880 Allergy status to penicillin: Secondary | ICD-10-CM | POA: Insufficient documentation

## 2018-12-18 DIAGNOSIS — I2510 Atherosclerotic heart disease of native coronary artery without angina pectoris: Secondary | ICD-10-CM | POA: Diagnosis not present

## 2018-12-18 HISTORY — PX: RADIOACTIVE SEED IMPLANT: SHX5150

## 2018-12-18 HISTORY — PX: SPACE OAR INSTILLATION: SHX6769

## 2018-12-18 SURGERY — INSERTION, RADIATION SOURCE, PROSTATE
Anesthesia: General | Site: Rectum

## 2018-12-18 MED ORDER — LIDOCAINE 2% (20 MG/ML) 5 ML SYRINGE
INTRAMUSCULAR | Status: AC
  Filled 2018-12-18: qty 5

## 2018-12-18 MED ORDER — DEXAMETHASONE SODIUM PHOSPHATE 10 MG/ML IJ SOLN
INTRAMUSCULAR | Status: AC
  Filled 2018-12-18: qty 1

## 2018-12-18 MED ORDER — OXYCODONE HCL 5 MG PO TABS
5.0000 mg | ORAL_TABLET | Freq: Once | ORAL | Status: DC | PRN
  Filled 2018-12-18: qty 1

## 2018-12-18 MED ORDER — MIDAZOLAM HCL 2 MG/2ML IJ SOLN
INTRAMUSCULAR | Status: AC
  Filled 2018-12-18: qty 2

## 2018-12-18 MED ORDER — SODIUM CHLORIDE (PF) 0.9 % IJ SOLN
INTRAMUSCULAR | Status: DC | PRN
  Administered 2018-12-18: 10 mL

## 2018-12-18 MED ORDER — OXYCODONE HCL 5 MG/5ML PO SOLN
5.0000 mg | Freq: Once | ORAL | Status: DC | PRN
  Filled 2018-12-18: qty 5

## 2018-12-18 MED ORDER — STERILE WATER FOR IRRIGATION IR SOLN
Status: DC | PRN
  Administered 2018-12-18: 3 mL

## 2018-12-18 MED ORDER — SODIUM CHLORIDE 0.9 % IV SOLN
INTRAVENOUS | Status: AC | PRN
  Administered 2018-12-18: 1000 mL

## 2018-12-18 MED ORDER — TRAMADOL HCL 50 MG PO TABS: 50.0000 mg | ORAL_TABLET | Freq: Four times a day (QID) | ORAL | 0 refills | Status: AC | PRN

## 2018-12-18 MED ORDER — FENTANYL CITRATE (PF) 100 MCG/2ML IJ SOLN
INTRAMUSCULAR | Status: DC | PRN
  Administered 2018-12-18: 50 ug via INTRAVENOUS
  Administered 2018-12-18 (×2): 25 ug via INTRAVENOUS

## 2018-12-18 MED ORDER — CIPROFLOXACIN IN D5W 400 MG/200ML IV SOLN
INTRAVENOUS | Status: AC
  Filled 2018-12-18: qty 200

## 2018-12-18 MED ORDER — MIDAZOLAM HCL 2 MG/2ML IJ SOLN
INTRAMUSCULAR | Status: DC | PRN
  Administered 2018-12-18: 2 mg via INTRAVENOUS

## 2018-12-18 MED ORDER — FLEET ENEMA 7-19 GM/118ML RE ENEM
1.0000 | ENEMA | Freq: Once | RECTAL | Status: AC
  Administered 2018-12-18: 1 via RECTAL
  Filled 2018-12-18: qty 1

## 2018-12-18 MED ORDER — PHENYLEPHRINE 40 MCG/ML (10ML) SYRINGE FOR IV PUSH (FOR BLOOD PRESSURE SUPPORT)
PREFILLED_SYRINGE | INTRAVENOUS | Status: DC | PRN
  Administered 2018-12-18 (×2): 40 ug via INTRAVENOUS

## 2018-12-18 MED ORDER — ACETAMINOPHEN 500 MG PO TABS
1000.0000 mg | ORAL_TABLET | Freq: Once | ORAL | Status: AC
  Administered 2018-12-18: 1000 mg via ORAL
  Filled 2018-12-18: qty 2

## 2018-12-18 MED ORDER — PROPOFOL 10 MG/ML IV BOLUS
INTRAVENOUS | Status: AC
  Filled 2018-12-18: qty 20

## 2018-12-18 MED ORDER — LACTATED RINGERS IV SOLN
INTRAVENOUS | Status: DC
  Administered 2018-12-18 (×2): via INTRAVENOUS
  Filled 2018-12-18: qty 1000

## 2018-12-18 MED ORDER — PROMETHAZINE HCL 25 MG/ML IJ SOLN
6.2500 mg | INTRAMUSCULAR | Status: DC | PRN
  Filled 2018-12-18: qty 1

## 2018-12-18 MED ORDER — LIDOCAINE 2% (20 MG/ML) 5 ML SYRINGE
INTRAMUSCULAR | Status: DC | PRN
  Administered 2018-12-18: 60 mg via INTRAVENOUS

## 2018-12-18 MED ORDER — CIPROFLOXACIN IN D5W 400 MG/200ML IV SOLN
400.0000 mg | INTRAVENOUS | Status: AC
  Administered 2018-12-18: 400 mg via INTRAVENOUS
  Filled 2018-12-18: qty 200

## 2018-12-18 MED ORDER — TAMSULOSIN HCL 0.4 MG PO CAPS: 0.4000 mg | ORAL_CAPSULE | Freq: Every day | ORAL | 11 refills | Status: DC | PRN

## 2018-12-18 MED ORDER — ACETAMINOPHEN 500 MG PO TABS
ORAL_TABLET | ORAL | Status: AC
  Filled 2018-12-18: qty 2

## 2018-12-18 MED ORDER — SENNOSIDES-DOCUSATE SODIUM 8.6-50 MG PO TABS: 1.0000 | ORAL_TABLET | Freq: Two times a day (BID) | ORAL | 0 refills | Status: DC

## 2018-12-18 MED ORDER — FENTANYL CITRATE (PF) 100 MCG/2ML IJ SOLN
INTRAMUSCULAR | Status: AC
  Filled 2018-12-18: qty 2

## 2018-12-18 MED ORDER — DEXAMETHASONE SODIUM PHOSPHATE 10 MG/ML IJ SOLN
INTRAMUSCULAR | Status: DC | PRN
  Administered 2018-12-18: 10 mg via INTRAVENOUS

## 2018-12-18 MED ORDER — HYDROMORPHONE HCL 1 MG/ML IJ SOLN
0.2500 mg | INTRAMUSCULAR | Status: DC | PRN
  Filled 2018-12-18: qty 0.5

## 2018-12-18 MED ORDER — ONDANSETRON HCL 4 MG/2ML IJ SOLN
INTRAMUSCULAR | Status: AC
  Filled 2018-12-18: qty 2

## 2018-12-18 MED ORDER — IOHEXOL 300 MG/ML  SOLN
INTRAMUSCULAR | Status: DC | PRN
  Administered 2018-12-18: 7 mL

## 2018-12-18 MED ORDER — KETOROLAC TROMETHAMINE 30 MG/ML IJ SOLN
30.0000 mg | Freq: Once | INTRAMUSCULAR | Status: AC | PRN
  Filled 2018-12-18: qty 1

## 2018-12-18 MED ORDER — PROPOFOL 10 MG/ML IV BOLUS
INTRAVENOUS | Status: DC | PRN
  Administered 2018-12-18: 180 mg via INTRAVENOUS

## 2018-12-18 MED ORDER — ONDANSETRON HCL 4 MG/2ML IJ SOLN
INTRAMUSCULAR | Status: DC | PRN
  Administered 2018-12-18: 4 mg via INTRAVENOUS

## 2018-12-18 SURGICAL SUPPLY — 46 items
BAG URINE DRAINAGE (UROLOGICAL SUPPLIES) ×3 IMPLANT
BLADE CLIPPER SURG (BLADE) ×3 IMPLANT
CATH FOLEY 2WAY SLVR  5CC 16FR (CATHETERS) ×1
CATH FOLEY 2WAY SLVR 5CC 16FR (CATHETERS) ×2 IMPLANT
CATH ROBINSON RED A/P 16FR (CATHETERS) IMPLANT
CATH ROBINSON RED A/P 20FR (CATHETERS) ×3 IMPLANT
CLOTH BEACON ORANGE TIMEOUT ST (SAFETY) ×3 IMPLANT
CONT SPECI 4OZ STER CLIK (MISCELLANEOUS) ×6 IMPLANT
COVER BACK TABLE 60X90IN (DRAPES) ×3 IMPLANT
COVER MAYO STAND STRL (DRAPES) ×3 IMPLANT
COVER WAND RF STERILE (DRAPES) ×3 IMPLANT
DRSG TEGADERM 4X4.75 (GAUZE/BANDAGES/DRESSINGS) ×5 IMPLANT
DRSG TEGADERM 8X12 (GAUZE/BANDAGES/DRESSINGS) ×5 IMPLANT
GAUZE SPONGE 4X4 3PLY NS LF (GAUZE/BANDAGES/DRESSINGS) ×1 IMPLANT
GLOVE BIO SURGEON STRL SZ 6 (GLOVE) IMPLANT
GLOVE BIO SURGEON STRL SZ 6.5 (GLOVE) ×1 IMPLANT
GLOVE BIO SURGEON STRL SZ7 (GLOVE) ×1 IMPLANT
GLOVE BIO SURGEON STRL SZ7.5 (GLOVE) ×3 IMPLANT
GLOVE BIO SURGEON STRL SZ8 (GLOVE) IMPLANT
GLOVE BIO SURGEON STRL SZ8.5 (GLOVE) ×1 IMPLANT
GLOVE BIOGEL PI IND STRL 6 (GLOVE) IMPLANT
GLOVE BIOGEL PI IND STRL 6.5 (GLOVE) IMPLANT
GLOVE BIOGEL PI IND STRL 7.0 (GLOVE) IMPLANT
GLOVE BIOGEL PI IND STRL 8 (GLOVE) IMPLANT
GLOVE BIOGEL PI IND STRL 8.5 (GLOVE) IMPLANT
GLOVE BIOGEL PI INDICATOR 6 (GLOVE)
GLOVE BIOGEL PI INDICATOR 6.5 (GLOVE)
GLOVE BIOGEL PI INDICATOR 7.0 (GLOVE)
GLOVE BIOGEL PI INDICATOR 8 (GLOVE)
GLOVE BIOGEL PI INDICATOR 8.5 (GLOVE) ×1
GLOVE ECLIPSE 8.0 STRL XLNG CF (GLOVE) ×3 IMPLANT
GOWN STRL REUS W/TWL LRG LVL3 (GOWN DISPOSABLE) ×4 IMPLANT
GOWN STRL REUS W/TWL XL LVL3 (GOWN DISPOSABLE) ×4 IMPLANT
HOLDER FOLEY CATH W/STRAP (MISCELLANEOUS) IMPLANT
I-Seed AgX100 ×70 IMPLANT
IMPL SPACEOAR SYSTEM 10ML (Spacer) ×2 IMPLANT
IMPLANT SPACEOAR SYSTEM 10ML (Spacer) ×3 IMPLANT
IV SOD CHL 0.9% 1000ML (IV SOLUTION) ×3 IMPLANT
KIT TURNOVER CYSTO (KITS) ×3 IMPLANT
MARKER SKIN DUAL TIP RULER LAB (MISCELLANEOUS) ×3 IMPLANT
PACK CYSTO (CUSTOM PROCEDURE TRAY) ×3 IMPLANT
SURGILUBE 2OZ TUBE FLIPTOP (MISCELLANEOUS) ×3 IMPLANT
SUT BONE WAX W31G (SUTURE) IMPLANT
SYR 10ML LL (SYRINGE) ×3 IMPLANT
UNDERPAD 30X30 (UNDERPADS AND DIAPERS) ×6 IMPLANT
WATER STERILE IRR 500ML POUR (IV SOLUTION) ×3 IMPLANT

## 2018-12-18 NOTE — Transfer of Care (Signed)
Immediate Anesthesia Transfer of Care Note  Patient: Sean Paul  Procedure(s) Performed: Procedure(s) (LRB): RADIOACTIVE SEED IMPLANT/BRACHYTHERAPY IMPLANT (N/A) SPACE OAR INSTILLATION (N/A)  Patient Location: PACU  Anesthesia Type: General  Level of Consciousness: awake, oriented, sedated and patient cooperative  Airway & Oxygen Therapy: Patient Spontanous Breathing and Patient connected to face mask oxygen  Post-op Assessment: Report given to PACU RN and Post -op Vital signs reviewed and stable  Post vital signs: Reviewed and stable  Complications: No apparent anesthesia complications  Last Vitals:  Vitals Value Taken Time  BP 125/72 12/18/2018  3:06 PM  Temp    Pulse 68 12/18/2018  3:09 PM  Resp 10 12/18/2018  3:09 PM  SpO2 98 % 12/18/2018  3:09 PM  Vitals shown include unvalidated device data.  Last Pain:  Vitals:   12/18/18 1046  TempSrc: Oral  PainSc: 0-No pain      Patients Stated Pain Goal: 6 (12/18/18 1046)

## 2018-12-18 NOTE — Anesthesia Procedure Notes (Signed)
Procedure Name: LMA Insertion Date/Time: 12/18/2018 1:46 PM Performed by: Suan Halter, CRNA Pre-anesthesia Checklist: Patient identified, Emergency Drugs available, Suction available and Patient being monitored Patient Re-evaluated:Patient Re-evaluated prior to induction Oxygen Delivery Method: Circle system utilized Preoxygenation: Pre-oxygenation with 100% oxygen Induction Type: IV induction Ventilation: Mask ventilation without difficulty LMA: LMA inserted LMA Size: 4.0 Number of attempts: 1 Airway Equipment and Method: Bite block Placement Confirmation: positive ETCO2 Tube secured with: Tape Dental Injury: Teeth and Oropharynx as per pre-operative assessment

## 2018-12-18 NOTE — Anesthesia Preprocedure Evaluation (Addendum)
Anesthesia Evaluation  Patient identified by MRN, date of birth, ID band Patient awake    Reviewed: Allergy & Precautions, NPO status , Patient's Chart, lab work & pertinent test results, reviewed documented beta blocker date and time   Airway Mallampati: II  TM Distance: >3 FB Neck ROM: Full    Dental  (+) Chipped,    Pulmonary former smoker,    Pulmonary exam normal breath sounds clear to auscultation       Cardiovascular + CAD, + Past MI, + Cardiac Stents and +CHF  Normal cardiovascular exam Rhythm:Regular Rate:Normal  ECG: NSR, rate 63  ECHO: LV EF: 55%  CATH: There is moderate left ventricular systolic dysfunction. Successful drug-eluting stent placement to the mid right coronary artery after thrombectomy. A 3.0 x 20 Synergy drug-eluting stent was postdilated to greater than 3.5 mm in diameter. Mid RCA lesion, 99% stenosed. There is a 0% residual stenosis post intervention. A drug-eluting stent was placed.   Sees cardiologist in [place]   Neuro/Psych  Neuromuscular disease negative psych ROS   GI/Hepatic negative GI ROS, Neg liver ROS,   Endo/Other  negative endocrine ROS  Renal/GU negative Renal ROS     Musculoskeletal negative musculoskeletal ROS (+)   Abdominal   Peds  Hematology HLD   Anesthesia Other Findings PROSTATE CANCER  Reproductive/Obstetrics                            Anesthesia Physical Anesthesia Plan  ASA: III  Anesthesia Plan: General   Post-op Pain Management:    Induction: Intravenous  PONV Risk Score and Plan: 3 and Ondansetron, Dexamethasone, Midazolam and Treatment may vary due to age or medical condition  Airway Management Planned: LMA  Additional Equipment:   Intra-op Plan:   Post-operative Plan: Extubation in OR  Informed Consent: I have reviewed the patients History and Physical, chart, labs and discussed the procedure including the risks,  benefits and alternatives for the proposed anesthesia with the patient or authorized representative who has indicated his/her understanding and acceptance.     Dental advisory given  Plan Discussed with: CRNA  Anesthesia Plan Comments:         Anesthesia Quick Evaluation

## 2018-12-18 NOTE — Discharge Instructions (Signed)
1 - You may have urinary urgency (bladder spasms) and bloody urine and stool on / off  X few weeks. This is normal.  2 - Call MD or go to ER for fever >102, severe pain / nausea / vomiting not relieved by medications, or acute change in medical status    Post Anesthesia Home Care Instructions  Activity: Get plenty of rest for the remainder of the day. A responsible individual must stay with you for 24 hours following the procedure.  For the next 24 hours, DO NOT: -Drive a car -Paediatric nurse -Drink alcoholic beverages -Take any medication unless instructed by your physician -Make any legal decisions or sign important papers.  Meals: Start with liquid foods such as gelatin or soup. Progress to regular foods as tolerated. Avoid greasy, spicy, heavy foods. If nausea and/or vomiting occur, drink only clear liquids until the nausea and/or vomiting subsides. Call your physician if vomiting continues.  Special Instructions/Symptoms: Your throat may feel dry or sore from the anesthesia or the breathing tube placed in your throat during surgery. If this causes discomfort, gargle with warm salt water. The discomfort should disappear within 24 hours.  If you had a scopolamine patch placed behind your ear for the management of post- operative nausea and/or vomiting:  1. The medication in the patch is effective for 72 hours, after which it should be removed.  Wrap patch in a tissue and discard in the trash. Wash hands thoroughly with soap and water. 2. You may remove the patch earlier than 72 hours if you experience unpleasant side effects which may include dry mouth, dizziness or visual disturbances. 3. Avoid touching the patch. Wash your hands with soap and water after contact with the patch.

## 2018-12-18 NOTE — H&P (Signed)
Sean Paul is an 63 y.o. male.    Chief Complaint: Pre-OP Prostate Brachytherapy Implantation and SPACE-OAR  HPI:   1- Moderate Risk Prostate Cancer - 3 cores mix of Gleason Group 1 and 2 cancer up to 50% by BX 07/2018 (RLB, RLM, LMB) on eval rising PSA to 5.5. TRUS 2mL, no median lobe but elongated anatomy. He was on exogenous androgens, now stopped    PMH sig for HLD, HTN, CAD/MI/Stent (now asymptomatic, ASA only, follows Cards in [employer]). 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 His PCP is Dr. Gerarda Fraction with [facility] in [place].   Today " Sean Paul" is seen to proceed with prostate brachytherapy in the primary treatment of his prostate cancer. Most recent UCX negative, no interval fevers.    Past Medical History:  Diagnosis Date  . CAD (coronary artery disease)    a. 05/2015: inferior MI/newly diagnosed CAD s/p DES to [facility] after thrombectomy with moderate residual LAD disease.  . Cancer [facility])    prostate  . History of tobacco abuse   . Hyperlipidemia   . Hypertriglyceridemia   . Ischemic cardiomyopathy    a. 05/2015: LVEF 35-45% by cath 05/2015.  Marland Kitchen Neuromuscular disorder ([place])    prior cervical fusion  . Sinus bradycardia    a. Limiting BB titration during 05/2015 admission.    Past Surgical History:  Procedure Laterality Date  . CARDIAC CATHETERIZATION N/A 06/07/2015   Procedure: Left Heart Cath and Coronary Angiography;  Surgeon: Jettie Booze, MD;  Location: [place] [facility];  Service: Cardiovascular;  Laterality: N/A;  . CARDIAC CATHETERIZATION N/A 06/09/2015   Procedure: Left Heart Cath and Coronary Angiography;  Surgeon: Jettie Booze, MD;  Location: [place] [facility];  Service: Cardiovascular;  Laterality: N/A;  . CARDIAC CATHETERIZATION  06/09/2015   Procedure: Coronary Stent Intervention;  Surgeon: Jettie Booze, MD;  Location: [place] [facility];  Service: Cardiovascular;;  . CERVICAL SPINE  SURGERY  12/25/2010  . COLONOSCOPY  06/17/2011   Procedure: COLONOSCOPY;  Surgeon: Daneil Dolin, MD;  Location: [facility];  Service: Endoscopy;  Laterality: N/A;  10:15 AM  . PERIPHERAL VASCULAR CATHETERIZATION  06/09/2015   Procedure: Thrombectomy;  Surgeon: Jettie Booze, MD;  Location: [place] [facility];  Service: Cardiovascular;;    Family History  Problem Relation Age of Onset  . Heart disease Mother 14       CABG/ LUNG CANCER  . Suicidality Father 79       SHOT HIS SELF  . Cancer - Lung Brother 54  . Heart attack Brother 31   Social History:  reports that he quit smoking about 28 years ago. His smoking use included cigarettes. He started smoking about 45 years ago. He has a 17.00 pack-year smoking history. He quit smokeless tobacco use about 28 years ago.  His smokeless tobacco use included snuff. He reports current alcohol use. He reports that he does not use drugs.  Allergies:  Allergies  Allergen Reactions  . Penicillins Itching and Swelling    No medications prior to admission.    No results found for this or any previous visit (from the past 48 hour(s)). No results found.  Review of Systems  Constitutional: Negative.  Negative for chills.  Genitourinary: Negative for dysuria, flank pain, frequency, hematuria and urgency.  All other systems reviewed and are negative.   Height 5\' 10"  (1.778 m), weight 79.4 kg. Physical Exam  Constitutional: He appears well-developed.  HENT:  Head: Normocephalic.  Eyes: Pupils are equal, round, and reactive to light.  Neck: Normal range of motion.  Cardiovascular: Normal rate.  Respiratory: Effort normal.  GI: Soft.  Genitourinary:    Genitourinary Comments: No CVAT   Musculoskeletal: Normal range of motion.  Neurological: He is alert.  Skin: Skin is warm.     Assessment/Plan  Proceed as planned with brachytherapy and SPACE-OAR placement. Risks, benefits, alternatives, expected peri-op course discussed  previously and reiterated today.   Alexis Frock, MD 12/18/2018, 8:31 AM

## 2018-12-18 NOTE — Brief Op Note (Signed)
12/18/2018  2:59 PM  PATIENT:  Sean Paul  63 y.o. male  PRE-OPERATIVE DIAGNOSIS:  PROSTATE CANCER  POST-OPERATIVE DIAGNOSIS:  PROSTATE CANCER  PROCEDURE:  Procedure(s) with comments: RADIOACTIVE SEED IMPLANT/BRACHYTHERAPY IMPLANT (N/A) - 2 HRS SPACE OAR INSTILLATION (N/A)  SURGEON:  Surgeon(s) and Role:    * Alexis Frock, MD - Primary    * Tyler Pita, MD - Assisting  PHYSICIAN ASSISTANT:   ASSISTANTS: none   ANESTHESIA:   general  EBL:  10 mL   BLOOD ADMINISTERED:none  DRAINS: none   LOCAL MEDICATIONS USED:  NONE  SPECIMEN:  No Specimen  DISPOSITION OF SPECIMEN:  N/A  COUNTS:  YES  TOURNIQUET:  * No tourniquets in log *  DICTATION: .Other Dictation: Dictation Number [id number]  PLAN OF CARE: Discharge to home after PACU  PATIENT DISPOSITION:  PACU - hemodynamically stable.   Delay start of Pharmacological VTE agent (>24hrs) due to surgical blood loss or risk of bleeding: yes

## 2018-12-18 NOTE — Op Note (Signed)
NAME: Sean Paul, Sean Paul MEDICAL RECORD EM:[id number] ACCOUNT [id number] DATE OF BIRTH:1956/03/15 FACILITY: [facility] LOCATION: [facility] PHYSICIAN:Makayleigh Poliquin, MD  OPERATIVE REPORT  DATE OF PROCEDURE:  12/18/2018  PREOPERATIVE DIAGNOSIS:  Moderate risk prostate cancer.  PROCEDURES: 1.  Radiation brachytherapy seed implantation. 2.  SpaceOAR gel implantation. 3.  Cystoscopy.  ESTIMATED BLOOD LOSS:  Nil.  COMPLICATIONS:  None.  SPECIMENS:  None.  FINDINGS: 1.  Excellent placement of radiation seeds per spot fluoroscopy. 2.  No evidence of intraluminal seeds following radiation seed implantation. 3.  Distal placement of 10 mL SpaceOAR perirectal gel between the perirectal fat and prostate.  No evidence of intraluminal injection.  RADIATION PARAMETERS:  70 seeds over 32 catheters delivering prescribed dose 145 Gy.  INDICATIONS:  The patient is a very pleasant 63 year old gentleman who was found on workup of elevated PSA to have multifocal low to moderate risk adenocarcinoma of the prostate.  Options discussed for management including surveillance protocols versus  ablative therapy versus surgical extirpation and he wished to proceed with primary brachytherapy with curative intent.  He has undergone evaluation by radiation oncology colleagues and felt to be a suitable candidate for both urologic and radiation  oncology perspective.  He presents for this today.  Informed consent was obtained and placed in medical record.  DESCRIPTION OF PROCEDURE:  The patient was identified.  Procedure of prostate brachytherapy implantation, cystoscopy and SpaceOAR placement was confirmed.  Procedure timeout was performed.  Oral antibiotics had been administered.  The patient was placed  into a medium lithotomy position.  His penis, perineum and proximal thighs were prepped.  Foley catheter was placed through the straight drain.  Transrectal ultrasound was then performed corroborating normal bilobar  prostatic anatomy.  No evidence of  intraluminal median lobe.  After radiation planning performed, 32 catheters were used to deliver 70 brachytherapy seeds for prescribed dose of 145 Gy from an anterior to posterior direction as per radiation oncology plan.  The genital ultrasound probe  was then taken off of anterior tension to further develop the plane of the perirectal fat and the SpaceOAR needle was inserted transperineally approximately 1 cm anterior to the anus into the perirectal fat plane mid gland midline of the prostate.  A  test injection of 2 mL of saline was placed, which confirmed placement of the injection needle into adequate position following which 10 mL of SpaceOAR gel matrix was injected as per manufacturer's specifications over 10 seconds.  Digital rectal exam was  performed and no evidence of intraluminal injection was noted.  Surgeon changed gloves.  Next, cystoscopy was performed using a 16-French flexible cystoscope.  Flexible cystoscopy was unremarkable of the anterior and posterior urethra.  Inspection of bladder revealed no diverticula, calcifications or papillary lesions.  Ureteral orifices were singleton.  There is no evidence of intraluminal brachytherapy seed  implantation.  Retroflexion revealed no additional findings.  Bladder was left partially full and the cystoscope was removed.  The procedure terminated.  The patient tolerated the procedure well.  No immediate perioperative complications.  The patient  was taken to postanesthesia care in stable condition with plan for discharge home.  TN/NUANCE  D:12/18/2018 T:12/18/2018 JOB:[id number]

## 2018-12-18 NOTE — Anesthesia Postprocedure Evaluation (Signed)
Anesthesia Post Note  Patient: Sean Paul  Procedure(s) Performed: RADIOACTIVE SEED IMPLANT/BRACHYTHERAPY IMPLANT (N/A Prostate) SPACE OAR INSTILLATION (N/A Rectum)     Patient location during evaluation: PACU Anesthesia Type: General Level of consciousness: awake and alert and oriented Pain management: pain level controlled Vital Signs Assessment: post-procedure vital signs reviewed and stable Respiratory status: spontaneous breathing, nonlabored ventilation and respiratory function stable Cardiovascular status: blood pressure returned to baseline and stable Postop Assessment: no apparent nausea or vomiting Anesthetic complications: no    Last Vitals:  Vitals:   12/18/18 1515 12/18/18 1530  BP: 116/71 110/63  Pulse: 67 66  Resp: 10 11  Temp:    SpO2: 97% 95%    Last Pain:  Vitals:   12/18/18 1530  TempSrc:   PainSc: 0-No pain                 Maclovia Uher A.

## 2018-12-21 ENCOUNTER — Encounter (HOSPITAL_BASED_OUTPATIENT_CLINIC_OR_DEPARTMENT_OTHER): Payer: Self-pay | Admitting: Urology

## 2018-12-22 NOTE — Progress Notes (Signed)
  Radiation Oncology         [phone] ________________________________  Name: Sean Paul MRN: [id number]  Date: 12/22/2018  DOB: 05-31-1956       Prostate Seed Implant  KD:TOIZT, Purcell Nails, MD  No ref. provider found  DIAGNOSIS: 63 y.o. gentleman with Stage T2a adenocarcinoma of the prostate with Gleason score of 3+4, and PSA of 5.5.    ICD-10-CM   1. Preop testing Z01.818 DG Chest 2 View    DG Chest 2 View    PROCEDURE: Insertion of radioactive I-125 seeds into the prostate gland.  RADIATION DOSE: 145 Gy, definitive therapy.  TECHNIQUE: Sean Paul was brought to the operating room with the urologist. He was placed in the dorsolithotomy position. He was catheterized and a rectal tube was inserted. The perineum was shaved, prepped and draped. The ultrasound probe was then introduced into the rectum to see the prostate gland.  TREATMENT DEVICE: A needle grid was attached to the ultrasound probe stand and anchor needles were placed.  3D PLANNING: The prostate was imaged in 3D using a sagittal sweep of the prostate probe. These images were transferred to the planning computer. There, the prostate, urethra and rectum were defined on each axial reconstructed image. Then, the software created an optimized 3D plan and a few seed positions were adjusted. The quality of the plan was reviewed using [facility] information for the target and the following two organs at risk:  Urethra and Rectum.  Then the accepted plan was printed and handed off to the radiation therapist.  Under my supervision, the custom loading of the seeds and spacers was carried out and loaded into sealed vicryl sleeves.  These pre-loaded needles were then placed into the needle holder.Marland Kitchen  PROSTATE VOLUME STUDY:  Using transrectal ultrasound the volume of the prostate was verified to be 37.0 cc.  SPECIAL TREATMENT PROCEDURE/SUPERVISION AND HANDLING: The pre-loaded needles were then delivered under sagittal guidance. A  total of 32 needles were used to deposit 70 seeds in the prostate gland. The individual seed activity was 0.445 mCi.  SpaceOAR:  Yes  COMPLEX SIMULATION: At the end of the procedure, an anterior radiograph of the pelvis was obtained to document seed positioning and count. Cystoscopy was performed to check the urethra and bladder.  MICRODOSIMETRY: At the end of the procedure, the patient was emitting 0.187 mR/hr at 1 meter. Accordingly, he was considered safe for hospital discharge.  PLAN: The patient will return to the radiation oncology clinic for post implant CT dosimetry in three weeks.   ________________________________  Sheral Apley Tammi Klippel, M.D.

## 2018-12-29 ENCOUNTER — Telehealth: Payer: Self-pay | Admitting: *Deleted

## 2018-12-29 NOTE — Telephone Encounter (Signed)
Called patient to inform of post seed appts. for 12-31-18, and his MRI for 01-01-19, lvm for a return call

## 2018-12-31 ENCOUNTER — Ambulatory Visit
Admission: RE | Admit: 2018-12-31 | Discharge: 2018-12-31 | Disposition: A | Discharge status: Home / Self Care | Payer: 59 | Source: Ambulatory Visit | Attending: Urology | Admitting: Urology

## 2018-12-31 ENCOUNTER — Other Ambulatory Visit: Payer: Self-pay

## 2018-12-31 ENCOUNTER — Encounter: Payer: Self-pay | Admitting: Urology

## 2018-12-31 ENCOUNTER — Ambulatory Visit
Admission: RE | Admit: 2018-12-31 | Discharge: 2018-12-31 | Disposition: A | Discharge status: Home / Self Care | Payer: 59 | Source: Ambulatory Visit | Attending: Radiation Oncology | Admitting: Radiation Oncology

## 2018-12-31 VITALS — BP 135/78 | HR 71 | Temp 98.2°F | Resp 16 | Wt 182.0 lb

## 2018-12-31 DIAGNOSIS — Z7982 Long term (current) use of aspirin: Secondary | ICD-10-CM | POA: Insufficient documentation

## 2018-12-31 DIAGNOSIS — C61 Malignant neoplasm of prostate: Secondary | ICD-10-CM | POA: Insufficient documentation

## 2018-12-31 DIAGNOSIS — Z923 Personal history of irradiation: Secondary | ICD-10-CM | POA: Insufficient documentation

## 2018-12-31 DIAGNOSIS — Z510 Encounter for antineoplastic radiation therapy: Secondary | ICD-10-CM | POA: Insufficient documentation

## 2018-12-31 DIAGNOSIS — R351 Nocturia: Secondary | ICD-10-CM | POA: Insufficient documentation

## 2018-12-31 DIAGNOSIS — Z79899 Other long term (current) drug therapy: Secondary | ICD-10-CM | POA: Insufficient documentation

## 2018-12-31 HISTORY — DX: Malignant neoplasm of prostate: C61

## 2018-12-31 NOTE — Progress Notes (Signed)
Radiation Oncology         [phone] ________________________________  Name: Sean Paul MRN: [id number]  Date: 12/31/2018  DOB: [birth year]  Post-Seed Follow-Up Visit Note  CC: Redmond School, MD  Redmond School, MD  Diagnosis:   63 y.o. gentleman with Stage T2a adenocarcinoma of the prostate with Gleason score of 3+4, and PSA of 5.5.    ICD-10-CM   1. Malignant neoplasm of prostate (HCC) C61     Interval Since Last Radiation:  2 weeks 12/18/18:  Insertion of radioactive I-125 seeds into the prostate gland; 145 Gy, definitive therapy with placement of SpaceOAR gel.  Narrative:  The patient returns today for routine follow-up.  He is complaining of increased urinary frequency and urinary hesitation symptoms. He filled out a questionnaire regarding urinary function today providing and overall IPSS score of 9 characterizing his symptoms as mild-moderate with slightly weakened flow of stream, intermittency and urgency.  His pre-implant score was 7.  He specifically denies dysuria, gross hematuria, straining to void, incomplete bladder emptying or incontinence.  He reports nocturia x1 which is his baseline.  He has continued taking Flomax since the time of his implant and feels that this does improve his LUTS.  He is tolerating the medication well.  He denies any abdominal pain, nausea, vomiting, diarrhea or constipation.  He reports a healthy appetite and is maintaining his weight.  Overall, he is quite pleased with his progress to date.    ALLERGIES:  is allergic to penicillins.  Meds: Current Outpatient Medications  Medication Sig Dispense Refill  . aspirin EC 81 MG tablet Take 1 tablet (81 mg total) by mouth daily. 30 tablet 11  . atorvastatin (LIPITOR) 80 MG tablet TAKE (1) TABLET BY MOUTH AT BEDTIME. 90 tablet 3  . Coenzyme Q10 (COQ-10) 100 MG CAPS Take 1 capsule by mouth daily.    [name] Oil 500 MG CAPS Take 1 capsule by mouth every morning.    . metoprolol tartrate  (LOPRESSOR) 25 MG tablet TAKE (1) TABLET BY MOUTH TWICE DAILY. 180 tablet 3  . NIASPAN 1000 MG CR tablet Take 1 tablet (1,000 mg total) by mouth daily. 90 tablet 3  . Omega-3 Fatty Acids (FISH OIL) 1200 MG CAPS Take 1 capsule by mouth every evening.    . senna-docusate (SENOKOT-S) 8.6-50 MG tablet Take 1 tablet by mouth 2 (two) times daily. While taking strong pain meds to prevent constipation 20 tablet 0  . tamsulosin (FLOMAX) 0.4 MG CAPS capsule Take 1 capsule (0.4 mg total) by mouth daily as needed (for urinary urgecny after prostate radiation). 30 capsule 11  . traMADol (ULTRAM) 50 MG tablet Take 1-2 tablets (50-100 mg total) by mouth every 6 (six) hours as needed for moderate pain or severe pain (Post-operatively). 20 tablet 0  . nitroGLYCERIN (NITROSTAT) 0.4 MG SL tablet Place 1 tablet (0.4 mg total) under the tongue every 5 (five) minutes as needed for chest pain (up to 3 doses). (Patient not taking: Reported on 10/07/2018) 25 tablet 3   No current facility-administered medications for this encounter.     Physical Findings: In general this is a well appearing Caucasian male in no acute distress.  He's alert and oriented x4 and appropriate throughout the examination. Cardiopulmonary assessment is negative for acute distress and he exhibits normal effort.   Lab Findings: Lab Results  Component Value Date   WBC 5.0 12/11/2018   HGB 15.1 12/11/2018   HCT 45.5 12/11/2018   MCV 96.8 12/11/2018  PLT 211 12/11/2018    Radiographic Findings:  Patient underwent CT imaging in our clinic for post implant dosimetry. The CT will be reviewed by Dr. Tammi Klippel to confirm an adequate distribution of radioactive seeds throughout the prostate gland and ensure that there are no seeds in or near the rectum.  He is scheduled for an MRI prostate on 01/01/2019 and these images will be fused with his CT images for further evaluation.  We suspect the final radiation plan and dosimetry will show appropriate  coverage of the prostate gland.   Impression/Plan: 63 y.o. gentleman with Stage T2a adenocarcinoma of the prostate with Gleason score of 3+4, and PSA of 5.5. The patient is recovering from the effects of radiation. His urinary symptoms should gradually improve over the next 4-6 months. We talked about this today. He is encouraged by his improvement already and is otherwise pleased with his outcome. We also talked about long-term follow-up for prostate cancer following seed implant. He understands that ongoing PSA determinations and digital rectal exams will help perform surveillance to rule out disease recurrence. He has a follow up appointment scheduled with Dr. Tresa Moore on 03/18/19. He understands what to expect with his PSA measures. Patient was also educated today about some of the long-term effects from radiation including a small risk for rectal bleeding and possibly erectile dysfunction. We talked about some of the general management approaches to these potential complications. However, I did encourage the patient to contact our office or return at any point if he has questions or concerns related to his previous radiation and prostate cancer.    Sean Johns, PA-C

## 2018-12-31 NOTE — Progress Notes (Signed)
Weight and vitals stable. Denies pain. Post seed IPSS 9. Denies dysuria or hematuria. Denies urinary incontinence or leakage. Reports weak urine stream is most problematic. Denies any bowel complaints. Scheduled to follow up with Dr. Tresa Moore on 03/18/2019. Scheduled for MRI to confirm SpaceOar placement tomorrow afternoon at 1630.    BP 135/78   Pulse 71   Temp 98.2 F (36.8 C)   Resp 16   Wt 182 lb (82.6 kg)   SpO2 98%   BMI 26.11 kg/m  Wt Readings from Last 3 Encounters:  12/31/18 182 lb (82.6 kg)  12/18/18 172 lb 6 oz (78.2 kg)  10/07/18 177 lb 8 oz (80.5 kg)

## 2019-01-01 ENCOUNTER — Ambulatory Visit (HOSPITAL_COMMUNITY)
Admission: RE | Admit: 2019-01-01 | Discharge: 2019-01-01 | Disposition: A | Discharge status: Home / Self Care | Payer: 59 | Source: Ambulatory Visit | Attending: Urology | Admitting: Urology

## 2019-01-01 DIAGNOSIS — C61 Malignant neoplasm of prostate: Secondary | ICD-10-CM

## 2019-01-03 NOTE — Progress Notes (Signed)
  Radiation Oncology         [phone] ________________________________  Name: Sean Paul MRN: [id number]  Date: 12/31/2018  DOB: May 03, 1956  COMPLEX SIMULATION NOTE  NARRATIVE:  The patient was brought to the [facility] today following prostate seed implantation approximately one month ago.  Identity was confirmed.  All relevant records and images related to the planned course of therapy were reviewed.  Then, the patient was set-up supine.  CT images were obtained.  The CT images were loaded into the planning software.  Then the prostate and rectum were contoured.  Treatment planning then occurred.  The implanted iodine 125 seeds were identified by the physics staff for projection of radiation distribution  I have requested : 3D Simulation  I have requested a DVH of the following structures: Prostate and rectum.    ________________________________  Sheral Apley Tammi Klippel, M.D.

## 2019-01-25 ENCOUNTER — Encounter: Payer: Self-pay | Admitting: Radiation Oncology

## 2019-01-25 DIAGNOSIS — C61 Malignant neoplasm of prostate: Secondary | ICD-10-CM | POA: Diagnosis not present

## 2019-01-25 DIAGNOSIS — Z510 Encounter for antineoplastic radiation therapy: Secondary | ICD-10-CM | POA: Diagnosis not present

## 2019-03-01 NOTE — Progress Notes (Signed)
  Radiation Oncology         [phone] ________________________________  Name: Sean Paul MRN: [id number]  Date: 01/25/2019  DOB: 1956-10-23  3D Planning Note   Prostate Brachytherapy Post-Implant Dosimetry  Diagnosis: 63 y.o. gentleman with Stage T2a adenocarcinoma of the prostate with Gleason score of 3+4, and PSA of 5.5  Narrative: On a previous date, [name] returned following prostate seed implantation for post implant planning. He underwent CT scan complex simulation to delineate the three-dimensional structures of the pelvis and demonstrate the radiation distribution.  Since that time, the seed localization, and complex isodose planning with dose volume histograms have now been completed.  Results:   Prostate Coverage - The dose of radiation delivered to the 90% or more of the prostate gland (D90) was 93.37% of the prescription dose. This exceeds our goal of greater than 90%. Rectal Sparing - The volume of rectal tissue receiving the prescription dose or higher was 0.0 cc. This falls under our thresholds tolerance of 1.0 cc.  Impression: The prostate seed implant appears to show adequate target coverage and appropriate rectal sparing.  Plan:  The patient will continue to follow with urology for ongoing PSA determinations. I would anticipate a high likelihood for local tumor control with minimal risk for rectal morbidity.  ________________________________  Sheral Apley Tammi Klippel, M.D.

## 2019-03-11 DIAGNOSIS — C61 Malignant neoplasm of prostate: Secondary | ICD-10-CM | POA: Diagnosis not present

## 2019-03-23 DIAGNOSIS — E349 Endocrine disorder, unspecified: Secondary | ICD-10-CM | POA: Diagnosis not present

## 2019-03-23 DIAGNOSIS — C61 Malignant neoplasm of prostate: Secondary | ICD-10-CM | POA: Diagnosis not present

## 2019-03-23 DIAGNOSIS — R3912 Poor urinary stream: Secondary | ICD-10-CM | POA: Diagnosis not present

## 2019-04-11 IMAGING — DX DG CHEST 2V
3 series · 3 of 3 positions shown · non-contrast
Comparison: 06/06/2015

CLINICAL DATA: Pre-op respiratory exam for prostate seed placement.
Prostate carcinoma.

EXAM:
CHEST - 2 VIEW

[chest pa]
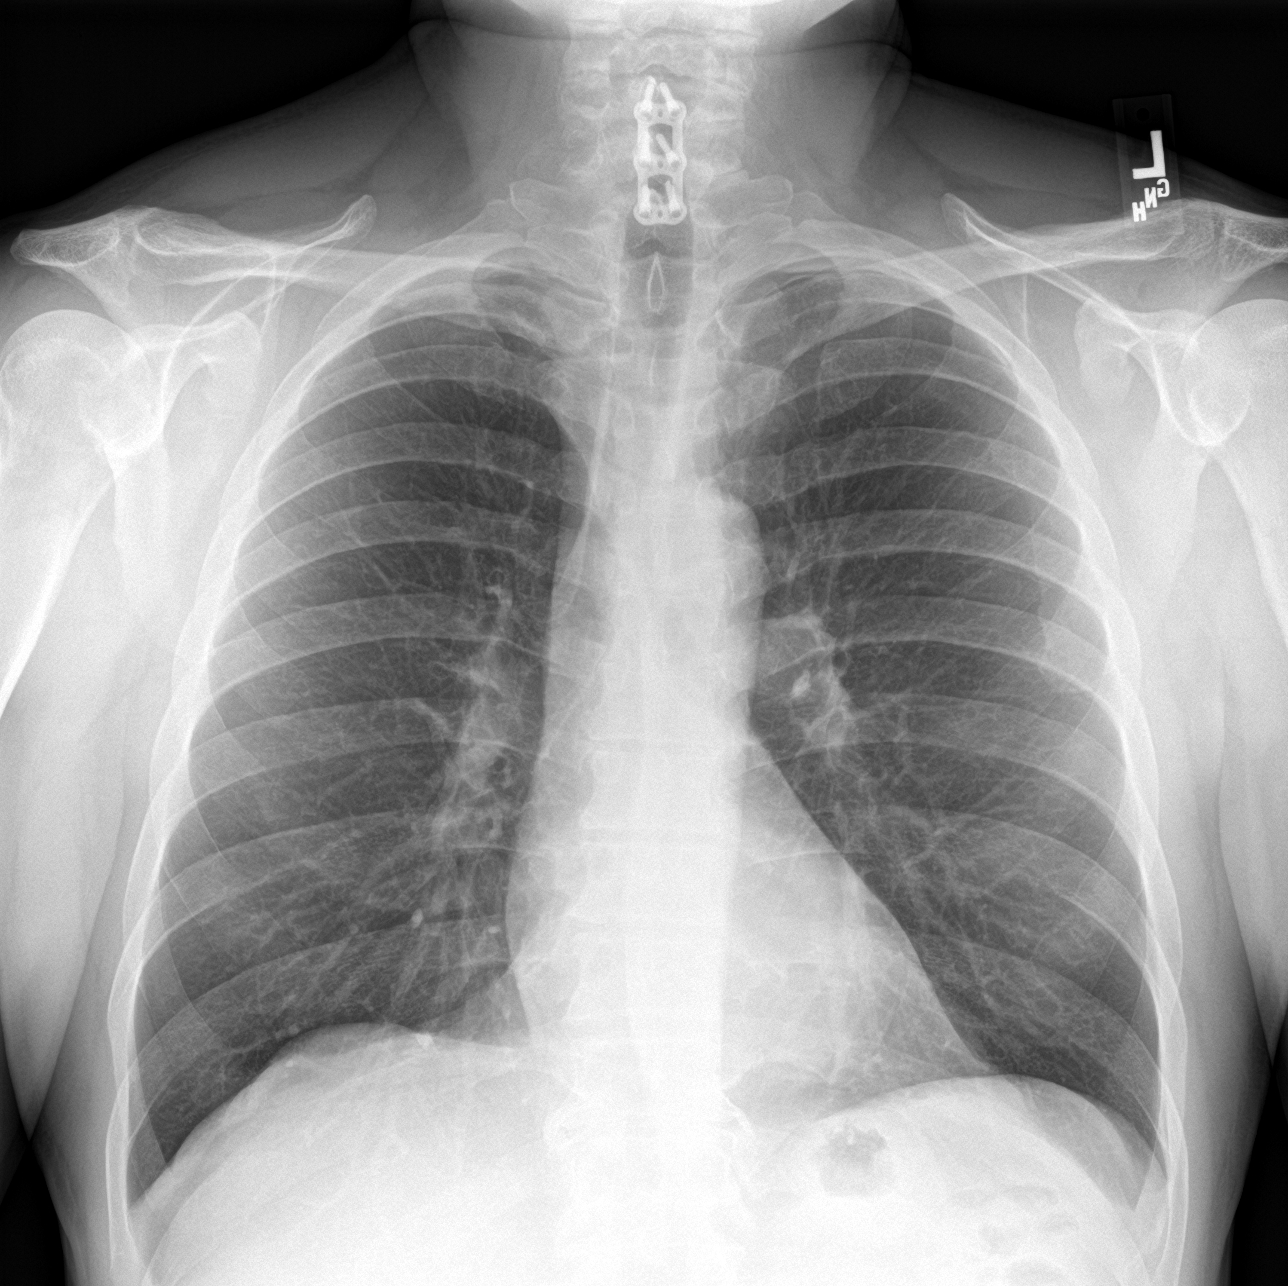

[chest lat (1 of 2)]
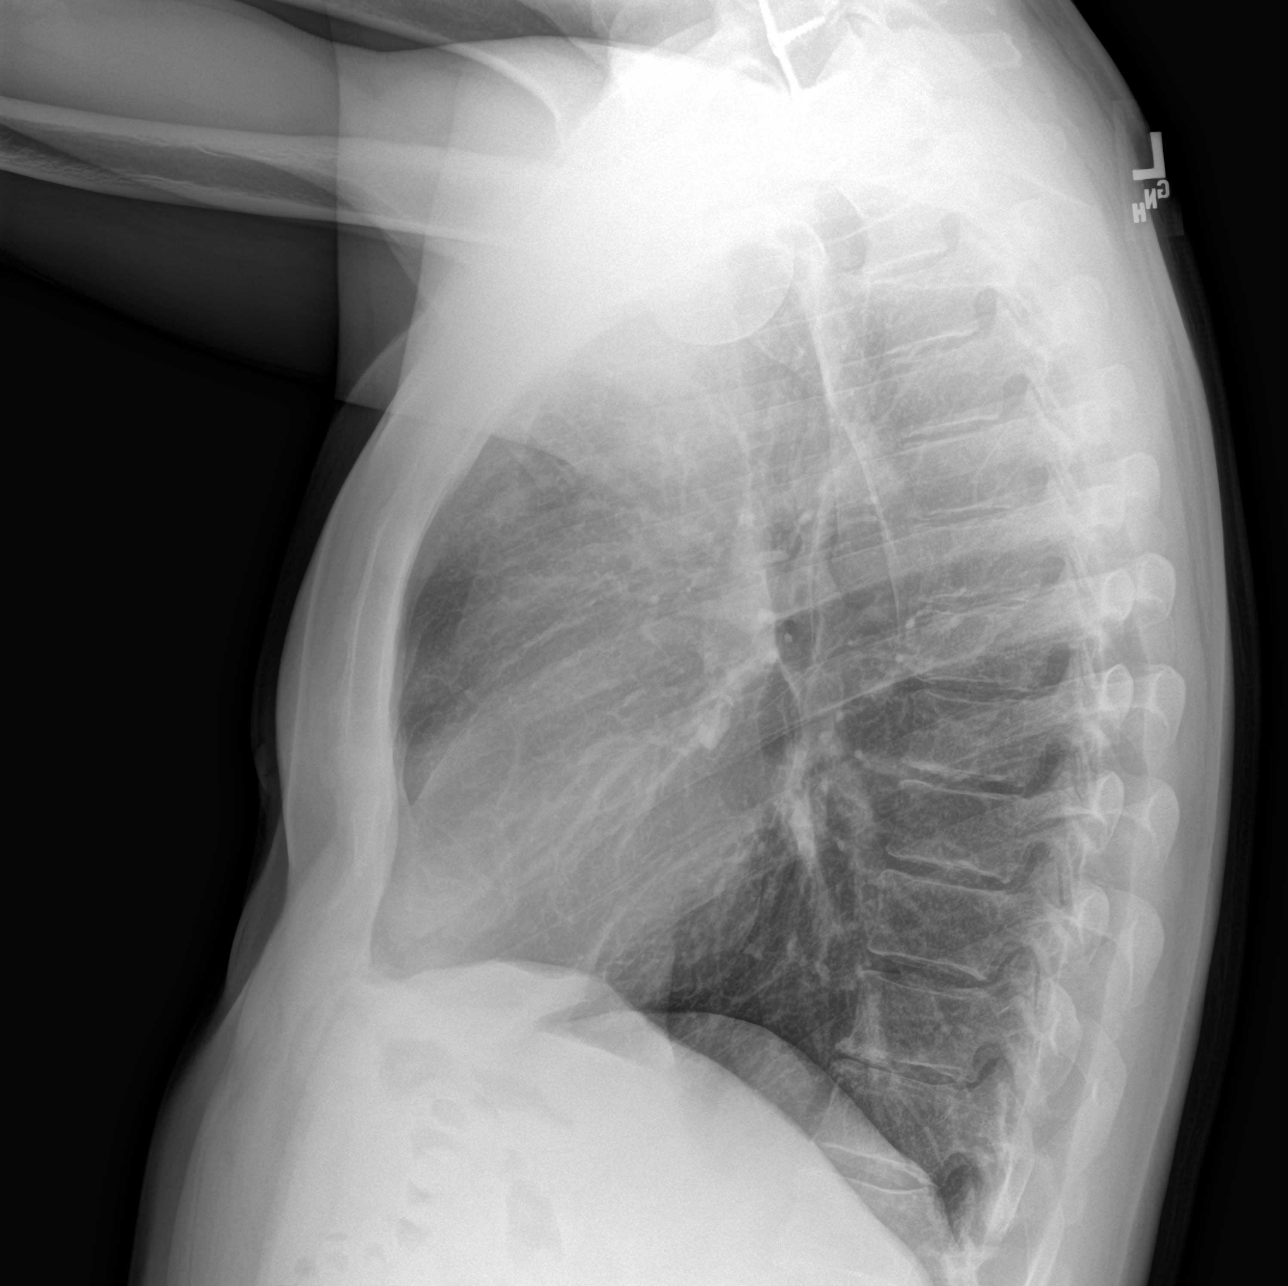

[chest lat (2 of 2)]
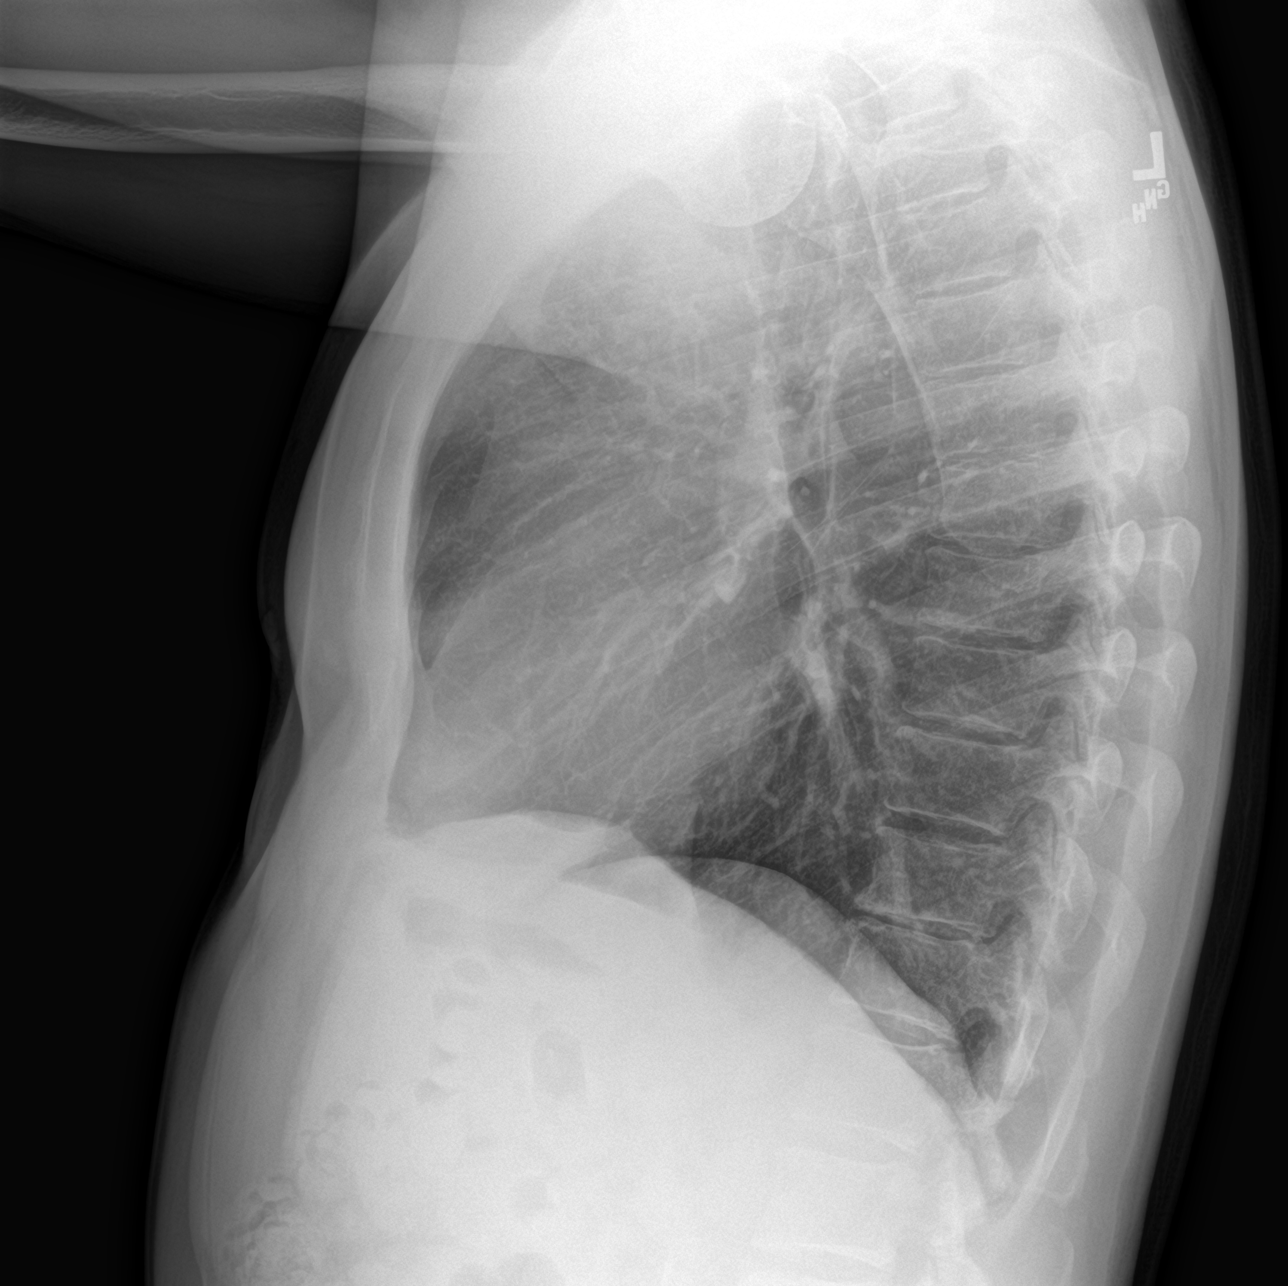

[3 of 3 positions shown; findings below may reference images not displayed]

FINDINGS: The heart size and mediastinal contours are within normal limits.
Both lungs are clear. The visualized skeletal structures are
unremarkable. Cervical spine fusion hardware again noted.
IMPRESSION: No active cardiopulmonary disease.

## 2019-11-12 ENCOUNTER — Ambulatory Visit (INDEPENDENT_AMBULATORY_CARE_PROVIDER_SITE_OTHER): Payer: BC Managed Care – PPO | Admitting: Cardiovascular Disease

## 2019-11-12 ENCOUNTER — Encounter: Payer: Self-pay | Admitting: Cardiovascular Disease

## 2019-11-12 ENCOUNTER — Other Ambulatory Visit: Payer: Self-pay

## 2019-11-12 VITALS — BP 132/74 | HR 57 | Ht 70.0 in | Wt 182.8 lb

## 2019-11-12 DIAGNOSIS — I25118 Atherosclerotic heart disease of native coronary artery with other forms of angina pectoris: Secondary | ICD-10-CM | POA: Diagnosis not present

## 2019-11-12 DIAGNOSIS — E782 Mixed hyperlipidemia: Secondary | ICD-10-CM | POA: Diagnosis not present

## 2019-11-12 DIAGNOSIS — Z955 Presence of coronary angioplasty implant and graft: Secondary | ICD-10-CM | POA: Diagnosis not present

## 2019-11-12 NOTE — Patient Instructions (Signed)

## 2019-11-12 NOTE — Progress Notes (Signed)
SUBJECTIVE: The patient presents for past due follow-up.  I last evaluated him in February 2019.  He has a history of coronary artery disease and sustained an inferior myocardial infarction in August 2016.  Echocardiogram on 06/23/15 demonstrated a marked improvement in left ventricular systolic function, LVEF [id number] with hypokinesis of the basal-mid inferior myocardium with associated grade 1 diastolic dysfunction. There was no significant valvular regurgitation.  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  The patient denies any symptoms of chest pain, palpitations, shortness of breath, lightheadedness, dizziness, leg swelling, orthopnea, PND, and syncope.  I personally reviewed the ECG performed today which demonstrates sinus rhythm with PVCs and nonspecific IVCD.  He was treated for prostate cancer in March 2020.  He has been doing well with respect to this.   Review of Systems: As per "subjective", otherwise negative.  Allergies  Allergen Reactions  . Penicillins Itching and Swelling    Current Outpatient Medications  Medication Sig Dispense Refill  . ALPRAZolam (XANAX) 0.5 MG tablet Take 0.5 mg by mouth 3 (three) times daily as needed.    Marland Kitchen aspirin EC 81 MG tablet Take 1 tablet (81 mg total) by mouth daily. 30 tablet 11  . atorvastatin (LIPITOR) 80 MG tablet TAKE (1) TABLET BY MOUTH AT BEDTIME. 90 tablet 3  . Coenzyme Q10 (COQ-10) 100 MG CAPS Take 1 capsule by mouth daily.    [name] Oil 500 MG CAPS Take 1 capsule by mouth every morning.    . metoprolol tartrate (LOPRESSOR) 25 MG tablet TAKE (1) TABLET BY MOUTH TWICE DAILY. 180 tablet 3  . NIASPAN 1000 MG CR tablet Take 1 tablet (1,000 mg total) by mouth daily. 90 tablet 3  . nitroGLYCERIN (NITROSTAT) 0.4 MG SL tablet Place 1 tablet (0.4 mg total) under the tongue every 5 (five) minutes as needed for chest pain (up to 3 doses). 25 tablet 3  .  Omega-3 Fatty Acids (FISH OIL) 1200 MG CAPS Take 1 capsule by mouth every evening.    . tamsulosin (FLOMAX) 0.4 MG CAPS capsule Take 1 capsule (0.4 mg total) by mouth daily as needed (for urinary urgecny after prostate radiation). 30 capsule 11  . traMADol (ULTRAM) 50 MG tablet Take 1-2 tablets (50-100 mg total) by mouth every 6 (six) hours as needed for moderate pain or severe pain (Post-operatively). 20 tablet 0   No current facility-administered medications for this visit.    Past Medical History:  Diagnosis Date  . CAD (coronary artery disease)    a. 05/2015: inferior MI/newly diagnosed CAD s/p DES to [facility] after thrombectomy with moderate residual LAD disease.  Marland Kitchen History of tobacco abuse   . Hyperlipidemia   . Hypertriglyceridemia   . Ischemic cardiomyopathy    a. 05/2015: LVEF 35-45% by cath 05/2015.  Marland Kitchen Neuromuscular disorder ([facility])    prior cervical fusion  . Prostate cancer ([place])   . Sinus bradycardia    a. Limiting BB titration during 05/2015 admission.    Past Surgical History:  Procedure Laterality Date  . CARDIAC CATHETERIZATION N/A 06/07/2015   Procedure: Left Heart Cath and Coronary Angiography;  Surgeon: Jettie Booze, MD;  Location: [place] [facility];  Service: Cardiovascular;  Laterality: N/A;  . CARDIAC CATHETERIZATION N/A 06/09/2015   Procedure: Left Heart Cath and Coronary Angiography;  Surgeon: Jettie Booze, MD;  Location: [place] [facility];  Service: Cardiovascular;  Laterality:  N/A;  . CARDIAC CATHETERIZATION  06/09/2015   Procedure: Coronary Stent Intervention;  Surgeon: Jettie Booze, MD;  Location: [place] [facility];  Service: Cardiovascular;;  . CERVICAL SPINE SURGERY  12/25/2010  . COLONOSCOPY  06/17/2011   Procedure: COLONOSCOPY;  Surgeon: Daneil Dolin, MD;  [facility];  Service: Endoscopy;  Laterality: N/A;  10:15 AM  . PERIPHERAL VASCULAR CATHETERIZATION  06/09/2015   Procedure: Thrombectomy;  Surgeon: Jettie Booze, MD;   Location: [place] [facility];  Service: Cardiovascular;;  . RADIOACTIVE SEED IMPLANT N/A 12/18/2018   Procedure: RADIOACTIVE SEED IMPLANT/BRACHYTHERAPY IMPLANT;  Surgeon: Alexis Frock, MD;  Location: [facility];  Service: Urology;  Laterality: N/A;  2 HRS  . SPACE OAR INSTILLATION N/A 12/18/2018   Procedure: SPACE OAR INSTILLATION;  Surgeon: Alexis Frock, MD;  Location[facility];  Service: Urology;  Laterality: N/A;    Social History   Socioeconomic History  . Marital status: Single    Spouse name: Not on file  . Number of children: Not on file  . Years of education: Not on file  . Highest education level: Not on file  Occupational History  . Not on file  Tobacco Use  . Smoking status: Former Smoker    Packs/day: 1.00    Years: 17.00    Pack years: 17.00    Types: Cigarettes    Start date: 05/09/1973    Quit date: 02/25/1990    Years since quitting: 29.7  . Smokeless tobacco: Former Systems developer    Types: Snuff    Quit date: 02/25/1990  Substance and Sexual Activity  . Alcohol use: Yes    Alcohol/week: 0.0 standard drinks    Comment: occ  . Drug use: No  . Sexual activity: Not on file  Other Topics Concern  . Not on file  Social History Narrative  . Not on file   Social Determinants of Health   Financial Resource Strain:   . Difficulty of Paying Living Expenses: Not on file  Food Insecurity:   . Worried About Charity fundraiser in the Last Year: Not on file  . Ran Out of Food in the Last Year: Not on file  Transportation Needs:   . Lack of Transportation (Medical): Not on file  . Lack of Transportation (Non-Medical): Not on file  Physical Activity:   . Days of Exercise per Week: Not on file  . Minutes of Exercise per Session: Not on file  Stress:   . Feeling of Stress : Not on file  Social Connections:   . Frequency of Communication with Friends and Family: Not on file  . Frequency of Social Gatherings with Friends and Family: Not on  file  . Attends Religious Services: Not on file  . Active Member of Clubs or Organizations: Not on file  . Attends Archivist Meetings: Not on file  . Marital Status: Not on file  Intimate Partner Violence:   . Fear of Current or Ex-Partner: Not on file  . Emotionally Abused: Not on file  . Physically Abused: Not on file  . Sexually Abused: Not on file     Vitals:   11/12/19 0754  BP: 132/74  Pulse: (!) 57  SpO2: 100%  Weight: 182 lb 12.8 oz (82.9 kg)  Height: 5\' 10"  (1.778 m)    Wt Readings from Last 3 Encounters:  11/12/19 182 lb 12.8 oz (82.9 kg)  12/31/18 182 lb (82.6 kg)  12/18/18 172  lb 6 oz (78.2 kg)     PHYSICAL EXAM General: NAD HEENT: Normal. Neck: No JVD, no thyromegaly. Lungs: Clear to auscultation bilaterally with normal respiratory effort. CV: Regular rate and rhythm, normal S1/S2, no S3/S4, no murmur. No pretibial or periankle edema.  No carotid bruit.   Abdomen: Soft, nontender, no distention.  Neurologic: Alert and oriented.  Psych: Normal affect. Skin: Normal. Musculoskeletal: No gross deformities.      Labs: Lab Results  Component Value Date/Time   K 5.0 12/11/2018 08:11 AM   BUN 14 12/11/2018 08:11 AM   CREATININE 0.88 12/11/2018 08:11 AM   ALT 49 (H) 12/11/2018 08:11 AM   TSH 1.076 06/07/2015 07:58 AM   HGB 15.1 12/11/2018 08:11 AM     Lipids: Lab Results  Component Value Date/Time   LDLCALC 36 08/18/2015 09:54 AM   CHOL 77 (L) 08/18/2015 09:54 AM   TRIG 67 08/18/2015 09:54 AM   HDL 28 (L) 08/18/2015 09:54 AM       ASSESSMENT AND PLAN:  1.  Coronary artery disease: History of inferior myocardial infarction status post PCI.  Symptomatically stable.  Continue aspirin, beta-blocker, and statin.  Did not tolerate Imdur in the past as it led to headaches.  2.  Hyperlipidemia: Continue statin therapy.   Disposition: Follow up 1 year   Kate Sable, M.D., F.A.C.C.

## 2020-03-17 DIAGNOSIS — C61 Malignant neoplasm of prostate: Secondary | ICD-10-CM | POA: Diagnosis not present

## 2020-03-23 DIAGNOSIS — E349 Endocrine disorder, unspecified: Secondary | ICD-10-CM | POA: Diagnosis not present

## 2020-03-23 DIAGNOSIS — C61 Malignant neoplasm of prostate: Secondary | ICD-10-CM | POA: Diagnosis not present

## 2020-03-23 DIAGNOSIS — R3912 Poor urinary stream: Secondary | ICD-10-CM | POA: Diagnosis not present

## 2020-06-02 DIAGNOSIS — Z0000 Encounter for general adult medical examination without abnormal findings: Secondary | ICD-10-CM | POA: Diagnosis not present

## 2020-06-02 DIAGNOSIS — E663 Overweight: Secondary | ICD-10-CM | POA: Diagnosis not present

## 2020-06-02 DIAGNOSIS — Z1389 Encounter for screening for other disorder: Secondary | ICD-10-CM | POA: Diagnosis not present

## 2020-06-02 DIAGNOSIS — Z6825 Body mass index (BMI) 25.0-25.9, adult: Secondary | ICD-10-CM | POA: Diagnosis not present

## 2020-06-02 DIAGNOSIS — E291 Testicular hypofunction: Secondary | ICD-10-CM | POA: Diagnosis not present

## 2020-06-02 DIAGNOSIS — E7849 Other hyperlipidemia: Secondary | ICD-10-CM | POA: Diagnosis not present

## 2020-06-02 DIAGNOSIS — F419 Anxiety disorder, unspecified: Secondary | ICD-10-CM | POA: Diagnosis not present

## 2020-06-02 DIAGNOSIS — I10 Essential (primary) hypertension: Secondary | ICD-10-CM | POA: Diagnosis not present

## 2020-06-02 DIAGNOSIS — M1000 Idiopathic gout, unspecified site: Secondary | ICD-10-CM | POA: Diagnosis not present

## 2020-06-02 DIAGNOSIS — Z79899 Other long term (current) drug therapy: Secondary | ICD-10-CM | POA: Diagnosis not present

## 2020-09-20 DIAGNOSIS — C61 Malignant neoplasm of prostate: Secondary | ICD-10-CM | POA: Diagnosis not present

## 2020-09-26 DIAGNOSIS — R3912 Poor urinary stream: Secondary | ICD-10-CM | POA: Diagnosis not present

## 2020-09-26 DIAGNOSIS — E349 Endocrine disorder, unspecified: Secondary | ICD-10-CM | POA: Diagnosis not present

## 2020-09-26 DIAGNOSIS — C61 Malignant neoplasm of prostate: Secondary | ICD-10-CM | POA: Diagnosis not present

## 2021-01-04 NOTE — Progress Notes (Signed)
Cardiology Office Note  Date: 01/05/2021   ID: Sean Paul, DOB 01-25-1956, MRN [id number]  PCP:  Redmond School, MD  Cardiologist:  No primary care provider on file. Electrophysiologist:  None   Chief Complaint: 1 year cardiac follow-up  History of Present Illness: Sean Paul is a 65 y.o. male with a history of NSTEMI, CAD, ischemic cardiomyopathy, sinus bradycardia, hypertriglyceridemia, history of tobacco abuse.  NSTEMI inferior August 2016.  Echo August 2016 improvement in left ventricular systolic function with LVEF of 55% with hypokinesis of the basal-mid inferior myocardium with associated grade 1 diastolic dysfunction.  No valvular regurgitation.   Last saw Dr. Bronson Ing 11/12/2019.  He denied any chest pain, palpitations, shortness of breath.  Continuing aspirin, beta-blocker, statin.  Did not tolerate Imdur in the past due to headaches.   He is here for 1 year follow-up today.  He denies any recent anginal or exertional symptoms.  Blood pressure well controlled at 128/74 with a heart rate of 68.  EKG today shows normal sinus rhythm with a rate of 62.  He continues on the regiment of aspirin, Lipitor, metoprolol, nitroglycerin and omega-3 fatty acid, with Niaspan, krill oil, and co-Q10.  States he had a recent wellness checkup with his primary care provider and had no issues.  States all lab work was normal.   Past Medical History:  Diagnosis Date  . CAD (coronary artery disease)    a. 05/2015: inferior MI/newly diagnosed CAD s/p DES to [facility] after thrombectomy with moderate residual LAD disease.  Marland Kitchen History of tobacco abuse   . Hyperlipidemia   . Hypertriglyceridemia   . Ischemic cardiomyopathy    a. 05/2015: LVEF 35-45% by cath 05/2015.  Marland Kitchen Neuromuscular disorder ([place])    prior cervical fusion  . Prostate cancer ([place])   . Sinus bradycardia    a. Limiting BB titration during 05/2015 admission.    Past Surgical History:  Procedure Laterality Date  .  CARDIAC CATHETERIZATION N/A 06/07/2015   Procedure: Left Heart Cath and Coronary Angiography;  Surgeon: Jettie Booze, MD;  Location: [place] [facility];  Service: Cardiovascular;  Laterality: N/A;  . CARDIAC CATHETERIZATION N/A 06/09/2015   Procedure: Left Heart Cath and Coronary Angiography;  Surgeon: Jettie Booze, MD;  Location: [place] [facility];  Service: Cardiovascular;  Laterality: N/A;  . CARDIAC CATHETERIZATION  06/09/2015   Procedure: Coronary Stent Intervention;  Surgeon: Jettie Booze, MD;  Location: [place] [facility];  Service: Cardiovascular;;  . CERVICAL SPINE SURGERY  12/25/2010  . COLONOSCOPY  06/17/2011   Procedure: COLONOSCOPY;  Surgeon: Daneil Dolin, MD;  [facility];  Service: Endoscopy;  Laterality: N/A;  10:15 AM  . PERIPHERAL VASCULAR CATHETERIZATION  06/09/2015   Procedure: Thrombectomy;  Surgeon: Jettie Booze, MD;  Location: [place] [facility];  Service: Cardiovascular;;  . RADIOACTIVE SEED IMPLANT N/A 12/18/2018   Procedure: RADIOACTIVE SEED IMPLANT/BRACHYTHERAPY IMPLANT;  Surgeon: Alexis Frock, MD;  Location: [facility];  Service: Urology;  Laterality: N/A;  2 HRS  . SPACE OAR INSTILLATION N/A 12/18/2018   Procedure: SPACE OAR INSTILLATION;  Surgeon: Alexis Frock, MD;  Location: [facility];  Service: Urology;  Laterality: N/A;    Current Outpatient Medications  Medication Sig Dispense Refill  . aspirin EC 81 MG tablet Take 1 tablet (81 mg total) by mouth daily. 30 tablet 11  . atorvastatin (LIPITOR) 80 MG tablet TAKE (1) TABLET BY MOUTH AT BEDTIME. 90 tablet  3  . Coenzyme Q10 (COQ-10) 100 MG CAPS Take 1 capsule by mouth daily.    [name] Oil 500 MG CAPS Take 1 capsule by mouth every morning.    . metoprolol tartrate (LOPRESSOR) 25 MG tablet TAKE (1) TABLET BY MOUTH TWICE DAILY. 180 tablet 3  . NIASPAN 1000 MG CR tablet Take 1 tablet (1,000 mg total) by mouth daily. 90 tablet 3  . nitroGLYCERIN  (NITROSTAT) 0.4 MG SL tablet Place 1 tablet (0.4 mg total) under the tongue every 5 (five) minutes as needed for chest pain (up to 3 doses). 25 tablet 3  . Omega-3 Fatty Acids (FISH OIL) 1200 MG CAPS Take 1 capsule by mouth every evening.     No current facility-administered medications for this visit.   Allergies:  Penicillins   Social History: The patient  reports that he quit smoking about 30 years ago. His smoking use included cigarettes. He started smoking about 47 years ago. He has a 17.00 pack-year smoking history. He quit smokeless tobacco use about 30 years ago.  His smokeless tobacco use included snuff. He reports current alcohol use. He reports that he does not use drugs.   Family History: The patient's family history includes Cancer - Lung (age of onset: 43) in his brother; Heart attack (age of onset: 45) in his brother; Heart disease (age of onset: 18) in his mother; Suicidality (age of onset: 30) in his father.   ROS:  Please see the history of present illness. Otherwise, complete review of systems is positive for none.  All other systems are reviewed and negative.   Physical Exam: VS:  BP 128/74   Pulse 68   Ht _0  (1.803 m)   Wt 176 lb (79.8 kg)   SpO2 94%   BMI 24.55 kg/m , BMI Body mass index is 24.55 kg/m.  Wt Readings from Last 3 Encounters:  01/05/21 176 lb (79.8 kg)  11/12/19 182 lb 12.8 oz (82.9 kg)  12/31/18 182 lb (82.6 kg)    General: Patient appears comfortable at rest. Neck: Supple, no elevated JVP or carotid bruits, no thyromegaly. Lungs: Clear to auscultation, nonlabored breathing at rest. Cardiac: Regular rate and rhythm, no S3 or significant systolic murmur, no pericardial rub. Extremities: No pitting edema, distal pulses 2+. Skin: Warm and dry. Musculoskeletal: No kyphosis. Neuropsychiatric: Alert and oriented x3, affect grossly appropriate.  ECG:  An ECG dated 01/05/2021 was personally reviewed today and demonstrated:  Normal sinus rhythm with  a rate of 62.  Recent Labwork: No results found for requested labs within last 8760 hours.     Component Value Date/Time   CHOL 77 (L) 08/18/2015 0954   TRIG 67 08/18/2015 0954   HDL 28 (L) 08/18/2015 0954   CHOLHDL 2.8 08/18/2015 0954   VLDL 13 08/18/2015 0954   LDLCALC 36 08/18/2015 0954    Other Studies Reviewed Today:  Echocardiogram 06/23/2015 Study Conclusions   - Left ventricle: The cavity size was normal. Wall thickness was  increased in a pattern of mild LVH. The estimated ejection  fraction was 55%. There is hypokinesis of the basal-midinferior  myocardium. Doppler parameters are consistent with abnormal left  ventricular relaxation (grade 1 diastolic dysfunction).  - Aortic valve: Mildly calcified annulus. Trileaflet; mildly  calcified leaflets. There was no significant regurgitation.  - Mitral valve: Calcified annulus. Mildly thickened leaflets .  There was trivial regurgitation.  - Right atrium: Central venous pressure (est): 3 mm Hg.  - Atrial septum: No defect or  patent foramen ovale was identified.  - Tricuspid valve: There was physiologic regurgitation.  - Pulmonary arteries: Systolic pressure could not be accurately  estimated.  - Pericardium, extracardiac: There was no pericardial effusion.   Impressions:   - Mild LVH with LVEF 55% and mid to basal inferior hypokinesis.  Grade 1 diastolic dysfunction. MAC with trivial mitral  regurgitation.    Conclusion   There is moderate left ventricular systolic dysfunction.  Successful drug-eluting stent placement to the mid right coronary artery after thrombectomy. A 3.0 x 20 Synergy drug-eluting stent was postdilated to greater than 3.5 mm in diameter.  Mid RCA lesion, 99% stenosed. There is a 0% residual stenosis post intervention.  A drug-eluting stent was placed.   Continue aggressive medical therapy for his LV dysfunction. Will reassess the patient several weeks from now. If he has  further angina, could consider reevaluation of the moderate disease in his LAD. If he does not have angina, would just pursue medical therapy. He can follow-up with me in the office. Anticipate discharge tomorrow.  Diagnostic Dominance: Co-dominant    Intervention      Assessment and Plan:  1. CAD in native artery   2. Mixed hyperlipidemia    1. CAD in native artery Denies any anginal or exertional symptoms.  States he recently had a wellness visit with primary care without any issues.  States all lab work was normal.  Continue aspirin 81 mg daily.  Continue nitroglycerin sublingual as needed.  Continue metoprolol 25 mg p.o. twice daily.  2. Mixed hyperlipidemia Continue atorvastatin 80 mg p.o. daily.  Medication Adjustments/Labs and Tests Ordered: Current medicines are reviewed at length with the patient today.  Concerns regarding medicines are outlined above.   Disposition: Follow-up with Dr. Harl Bowie or APP 1 year  Signed, Levell July, NP 01/05/2021 2:05 PM    [facility], [place], [place] [address] Phone: [phone]; Fax: [phone]

## 2021-01-05 ENCOUNTER — Encounter: Payer: Self-pay | Admitting: Family Medicine

## 2021-01-05 ENCOUNTER — Ambulatory Visit: Payer: BC Managed Care – PPO | Admitting: Family Medicine

## 2021-01-05 ENCOUNTER — Other Ambulatory Visit: Payer: Self-pay

## 2021-01-05 VITALS — BP 128/74 | HR 68 | Ht 71.0 in | Wt 176.0 lb

## 2021-01-05 DIAGNOSIS — I2510 Atherosclerotic heart disease of native coronary artery without angina pectoris: Secondary | ICD-10-CM

## 2021-01-05 DIAGNOSIS — E782 Mixed hyperlipidemia: Secondary | ICD-10-CM

## 2021-01-05 NOTE — Addendum Note (Signed)
Addended by: Julian Hy T on: 01/05/2021 02:23 PM   Modules accepted: Orders

## 2021-01-05 NOTE — Patient Instructions (Signed)
Your physician wants you to follow-up in: 1 YEAR WITH MD You will receive a reminder letter in the mail two months in advance. If you don't receive a letter, please call our office to schedule the follow-up appointment.  Your physician recommends that you continue on your current medications as directed. Please refer to the Current Medication list given to you today.  Thank you for choosing  [facility]!!   

## 2021-03-16 DIAGNOSIS — C61 Malignant neoplasm of prostate: Secondary | ICD-10-CM | POA: Diagnosis not present

## 2021-03-22 DIAGNOSIS — C61 Malignant neoplasm of prostate: Secondary | ICD-10-CM | POA: Diagnosis not present

## 2021-03-22 DIAGNOSIS — R3912 Poor urinary stream: Secondary | ICD-10-CM | POA: Diagnosis not present

## 2021-03-22 DIAGNOSIS — N5201 Erectile dysfunction due to arterial insufficiency: Secondary | ICD-10-CM | POA: Diagnosis not present

## 2021-05-21 ENCOUNTER — Encounter: Payer: Self-pay | Admitting: *Deleted

## 2021-06-08 ENCOUNTER — Encounter: Payer: Self-pay | Admitting: Cardiology

## 2021-06-08 DIAGNOSIS — Z1389 Encounter for screening for other disorder: Secondary | ICD-10-CM | POA: Diagnosis not present

## 2021-06-08 DIAGNOSIS — Z0000 Encounter for general adult medical examination without abnormal findings: Secondary | ICD-10-CM | POA: Diagnosis not present

## 2021-06-08 DIAGNOSIS — Z1331 Encounter for screening for depression: Secondary | ICD-10-CM | POA: Diagnosis not present

## 2021-06-08 DIAGNOSIS — I10 Essential (primary) hypertension: Secondary | ICD-10-CM | POA: Diagnosis not present

## 2021-06-08 DIAGNOSIS — Z6824 Body mass index (BMI) 24.0-24.9, adult: Secondary | ICD-10-CM | POA: Diagnosis not present

## 2021-06-08 DIAGNOSIS — E291 Testicular hypofunction: Secondary | ICD-10-CM | POA: Diagnosis not present

## 2021-06-08 DIAGNOSIS — Z125 Encounter for screening for malignant neoplasm of prostate: Secondary | ICD-10-CM | POA: Diagnosis not present

## 2021-06-08 DIAGNOSIS — E782 Mixed hyperlipidemia: Secondary | ICD-10-CM | POA: Diagnosis not present

## 2021-08-14 ENCOUNTER — Ambulatory Visit: Payer: BC Managed Care – PPO | Admitting: Urology

## 2021-09-19 DIAGNOSIS — C61 Malignant neoplasm of prostate: Secondary | ICD-10-CM | POA: Diagnosis not present

## 2021-09-25 DIAGNOSIS — R3912 Poor urinary stream: Secondary | ICD-10-CM | POA: Diagnosis not present

## 2021-09-25 DIAGNOSIS — C61 Malignant neoplasm of prostate: Secondary | ICD-10-CM | POA: Diagnosis not present

## 2021-09-25 DIAGNOSIS — N5201 Erectile dysfunction due to arterial insufficiency: Secondary | ICD-10-CM | POA: Diagnosis not present

## 2021-09-25 DIAGNOSIS — E349 Endocrine disorder, unspecified: Secondary | ICD-10-CM | POA: Diagnosis not present

## 2021-10-10 ENCOUNTER — Other Ambulatory Visit (HOSPITAL_COMMUNITY): Payer: Self-pay | Admitting: Urology

## 2021-10-10 DIAGNOSIS — C61 Malignant neoplasm of prostate: Secondary | ICD-10-CM

## 2021-10-23 DIAGNOSIS — R972 Elevated prostate specific antigen [PSA]: Secondary | ICD-10-CM | POA: Diagnosis not present

## 2021-10-23 DIAGNOSIS — C61 Malignant neoplasm of prostate: Secondary | ICD-10-CM | POA: Diagnosis not present

## 2021-10-30 DIAGNOSIS — R3912 Poor urinary stream: Secondary | ICD-10-CM | POA: Diagnosis not present

## 2021-10-30 DIAGNOSIS — E349 Endocrine disorder, unspecified: Secondary | ICD-10-CM | POA: Diagnosis not present

## 2021-10-30 DIAGNOSIS — C61 Malignant neoplasm of prostate: Secondary | ICD-10-CM | POA: Diagnosis not present

## 2021-10-30 DIAGNOSIS — N5201 Erectile dysfunction due to arterial insufficiency: Secondary | ICD-10-CM | POA: Diagnosis not present

## 2021-11-12 ENCOUNTER — Encounter (HOSPITAL_COMMUNITY): Payer: BC Managed Care – PPO

## 2021-11-16 ENCOUNTER — Encounter (HOSPITAL_COMMUNITY)
Admission: RE | Admit: 2021-11-16 | Discharge: 2021-11-16 | Disposition: A | Discharge status: Home / Self Care | Payer: BC Managed Care – PPO | Source: Ambulatory Visit | Attending: Urology | Admitting: Urology

## 2021-11-16 ENCOUNTER — Other Ambulatory Visit: Payer: Self-pay

## 2021-11-16 DIAGNOSIS — C61 Malignant neoplasm of prostate: Secondary | ICD-10-CM | POA: Insufficient documentation

## 2021-11-16 DIAGNOSIS — Z981 Arthrodesis status: Secondary | ICD-10-CM | POA: Diagnosis not present

## 2021-11-16 MED ORDER — FLUDEOXYGLUCOSE F - 18 (FDG) INJECTION
8.1600 | Freq: Once | INTRAVENOUS | Status: AC | PRN
  Administered 2021-11-16: 8.16 via INTRAVENOUS

## 2021-12-25 DIAGNOSIS — E349 Endocrine disorder, unspecified: Secondary | ICD-10-CM | POA: Diagnosis not present

## 2021-12-25 DIAGNOSIS — C61 Malignant neoplasm of prostate: Secondary | ICD-10-CM | POA: Diagnosis not present

## 2021-12-25 DIAGNOSIS — R3912 Poor urinary stream: Secondary | ICD-10-CM | POA: Diagnosis not present

## 2021-12-25 DIAGNOSIS — N5201 Erectile dysfunction due to arterial insufficiency: Secondary | ICD-10-CM | POA: Diagnosis not present

## 2022-03-15 ENCOUNTER — Encounter: Payer: Self-pay | Admitting: Cardiology

## 2022-03-15 ENCOUNTER — Ambulatory Visit: Payer: BC Managed Care – PPO | Admitting: Cardiology

## 2022-03-15 ENCOUNTER — Encounter: Payer: Self-pay | Admitting: *Deleted

## 2022-03-15 VITALS — BP 120/70 | HR 61 | Ht 70.5 in | Wt 179.4 lb

## 2022-03-15 DIAGNOSIS — E782 Mixed hyperlipidemia: Secondary | ICD-10-CM | POA: Diagnosis not present

## 2022-03-15 DIAGNOSIS — I2510 Atherosclerotic heart disease of native coronary artery without angina pectoris: Secondary | ICD-10-CM | POA: Diagnosis not present

## 2022-03-15 NOTE — Progress Notes (Signed)
Clinical Summary Sean Paul is a 66 y.o.male former patient of Dr Bronson Ing, this is our first visit together. Seen for the following medical problems.  1.CAD -history of MI 05/2015, DES to RCA - 05/2015 echo LVEF 55% -imdur caused headaches in the past - no recent chest pains, no SOB/DOE - compliant with meds   2.Hyperlipidemia - he is on atrovastatin 36m daily - labs followed by pcp   3. AAA screen - former smoker - wants to hold off AAA screen     SH: works [employer] Past Medical History:  Diagnosis Date   CAD (coronary artery disease)    a. 05/2015: inferior MI/newly diagnosed CAD [facility] with moderate residual LAD disease.   History of tobacco abuse    Hyperlipidemia    Hypertriglyceridemia    Ischemic cardiomyopathy    a. 05/2015: LVEF 35-45% by cath 05/2015.   Neuromuscular disorder ([place]    prior cervical fusion   Prostate cancer (HCC)    Sinus bradycardia    a. Limiting BB titration during 05/2015 admission.     Allergies  Allergen Reactions   Penicillins Itching and Swelling     Current Outpatient Medications  Medication Sig Dispense Refill   aspirin EC 81 MG tablet Take 1 tablet (81 mg total) by mouth daily. 30 tablet 11   atorvastatin (LIPITOR) 80 MG tablet TAKE (1) TABLET BY MOUTH AT BEDTIME. 90 tablet 3   Coenzyme Q10 (COQ-10) 100 MG CAPS Take 1 capsule by mouth daily.     Krill Oil 500 MG CAPS Take 1 capsule by mouth every morning.     metoprolol tartrate (LOPRESSOR) 25 MG tablet TAKE (1) TABLET BY MOUTH TWICE DAILY. 180 tablet 3   NIASPAN 1000 MG CR tablet Take 1 tablet (1,000 mg total) by mouth daily. 90 tablet 3   nitroGLYCERIN (NITROSTAT) 0.4 MG SL tablet Place 1 tablet (0.4 mg total) under the tongue every 5 (five) minutes as needed for chest pain (up to 3 doses). 25 tablet 3   Omega-3 Fatty Acids (FISH OIL) 1200 MG CAPS Take 1 capsule by mouth every evening.     No current  facility-administered medications for this visit.     Past Surgical History:  Procedure Laterality Date   CARDIAC CATHETERIZATION N/A 06/07/2015   Procedure: Left Heart Cath and Coronary Angiography;  Surgeon: JJettie Booze MD;  [facility];  Service: Cardiovascular;  Laterality: N/A;   CARDIAC CATHETERIZATION N/A 06/09/2015   Procedure: Left Heart Cath and Coronary Angiography;  Surgeon: JJettie Booze MD;  [facility];  Service: Cardiovascular;  Laterality: N/A;   CARDIAC CATHETERIZATION  06/09/2015   Procedure: Coronary Stent Intervention;  Surgeon: JJettie Booze MD;  [facility];  Service: Cardiovascular;;   CERVICAL SPINE SURGERY  12/25/2010   COLONOSCOPY  06/17/2011   Procedure: COLONOSCOPY;  Surgeon: RDaneil Dolin MD;  [facility];  Service: Endoscopy;  Laterality: N/A;  10:15 AM   PERIPHERAL VASCULAR CATHETERIZATION  06/09/2015   Procedure: Thrombectomy;  Surgeon: JJettie Booze MD;  [facility];  Service: Cardiovascular;;   RADIOACTIVE SEED IMPLANT N/A 12/18/2018   Procedure: RADIOACTIVE SEED IMPLANT/BRACHYTHERAPY IMPLANT;  Surgeon: MAlexis Frock MD;  [facility]  Service: Urology;  Laterality: N/A;  2 HRS   SPACE OAR INSTILLATION N/A 12/18/2018   Procedure: SPACE OAR INSTILLATION;  Surgeon:  Alexis Frock, MD;  Location: [facility];  Service: Urology;  Laterality: N/A;     Allergies  Allergen Reactions   Penicillins Itching and Swelling      Family History  Problem Relation Age of Onset   Heart disease Mother 98       CABG/ LUNG CANCER   Suicidality Father 88       SHOT HIS SELF   Cancer - Lung Brother 63   Heart attack Brother 65     Social History Sean Paul reports that he quit smoking about 32 years ago. His smoking use included cigarettes. He started smoking about 48 years ago. He has a 17.00 pack-year smoking  history. He quit smokeless tobacco use about 32 years ago.  His smokeless tobacco use included snuff. Sean Paul reports current alcohol use.   Review of Systems CONSTITUTIONAL: No weight loss, fever, chills, weakness or fatigue.  HEENT: Eyes: No visual loss, blurred vision, double vision or yellow sclerae.No hearing loss, sneezing, congestion, runny nose or sore throat.  SKIN: No rash or itching.  CARDIOVASCULAR: per hpi RESPIRATORY: No shortness of breath, cough or sputum.  GASTROINTESTINAL: No anorexia, nausea, vomiting or diarrhea. No abdominal pain or blood.  GENITOURINARY: No burning on urination, no polyuria NEUROLOGICAL: No headache, dizziness, syncope, paralysis, ataxia, numbness or tingling in the extremities. No change in bowel or bladder control.  MUSCULOSKELETAL: No muscle, back pain, joint pain or stiffness.  LYMPHATICS: No enlarged nodes. No history of splenectomy.  PSYCHIATRIC: No history of depression or anxiety.  ENDOCRINOLOGIC: No reports of sweating, cold or heat intolerance. No polyuria or polydipsia.  Marland Kitchen   Physical Examination Today's Vitals   03/15/22 0810  BP: 120/70  Pulse: 61  SpO2: 99%  Weight: 179 lb 6.4 oz (81.4 kg)  Height: 5' 10.5" (1.791 m)   Body mass index is 25.38 kg/m.  Gen: resting comfortably, no acute distress HEENT: no scleral icterus, pupils equal round and reactive, no palptable cervical adenopathy,  CV: RRR, no m/r/g no jvd Resp: Clear to auscultation bilaterally GI: abdomen is soft, non-tender, non-distended, normal bowel sounds, no hepatosplenomegaly MSK: extremities are warm, no edema.  Skin: warm, no rash Neuro:  no focal deficits Psych: appropriate affect   Diagnostic Studies   Echocardiogram 06/23/2015 Study Conclusions   - Left ventricle: The cavity size was normal. Wall thickness was    increased in a pattern of mild LVH. The estimated ejection    fraction was 55%. There is hypokinesis of the basal-midinferior     myocardium. Doppler parameters are consistent with abnormal left    ventricular relaxation (grade 1 diastolic dysfunction).  - Aortic valve: Mildly calcified annulus. Trileaflet; mildly    calcified leaflets. There was no significant regurgitation.  - Mitral valve: Calcified annulus. Mildly thickened leaflets .    There was trivial regurgitation.  - Right atrium: Central venous pressure (est): 3 mm Hg.  - Atrial septum: No defect or patent foramen ovale was identified.  - Tricuspid valve: There was physiologic regurgitation.  - Pulmonary arteries: Systolic pressure could not be accurately    estimated.  - Pericardium, extracardiac: There was no pericardial effusion.   Impressions:   - Mild LVH with LVEF 55% and mid to basal inferior hypokinesis.    Grade 1 diastolic dysfunction. MAC with trivial mitral    regurgitation.      Conclusion   There is moderate left ventricular systolic dysfunction. Successful drug-eluting stent placement to the mid right coronary artery  after thrombectomy. A 3.0 x 20 Synergy drug-eluting stent was postdilated to greater than 3.5 mm in diameter. Mid RCA lesion, 99% stenosed. There is a 0% residual stenosis post intervention. A drug-eluting stent was placed.   Continue aggressive medical therapy for his LV dysfunction. Will reassess the patient several weeks from now. If he has further angina, could consider reevaluation of the moderate disease in his LAD. If he does not have angina, would just pursue medical therapy. He can follow-up with me in the office. Anticipate discharge tomorrow.   Diagnostic Dominance: Co-dominant     Intervention            Assessment and Plan  CAD - no recent symptoms, continue current meds - EKG SR, no acute ischemic changes  2. Hyperlipidemia - request labs from pcp, continue current meds  3. AAA screen - discussed AAA screenign [place] in setting of male over 59 with smoking history, he would like to hold off at  this time.   F/u 1 year    Arnoldo Lenis, M.D.

## 2022-03-15 NOTE — Patient Instructions (Signed)

## 2022-04-21 IMAGING — CT NM PET TUM IMG SKULL BASE T - THIGH
1 of 7 series · 2 of 25 positions shown · non-contrast
Comparison: Is

CLINICAL DATA: Prostate carcinoma with biochemical recurrence.

EXAM:
NUCLEAR MEDICINE PET SKULL BASE TO THIGH
TECHNIQUE: 8.2 mCi F18 Piflufolastat (Pylarify) was injected intravenously.
Full-ring PET imaging was performed from the skull base to thigh
after the radiotracer. CT data was obtained and used for attenuation
correction and anatomic localization.

[Series 4: ct sk_thigh 5.0 bf37 · axial · 5.0mm · 0.98mm/px · z∈[-1172,-916]mm · 2 of 257 slices shown]
[im 65/257  brain]
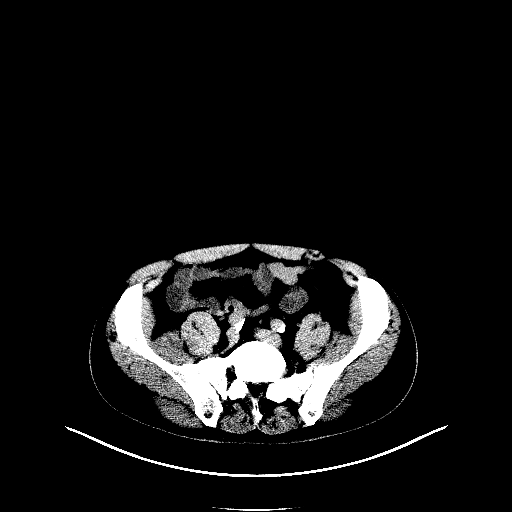
[im 129/257  brain]
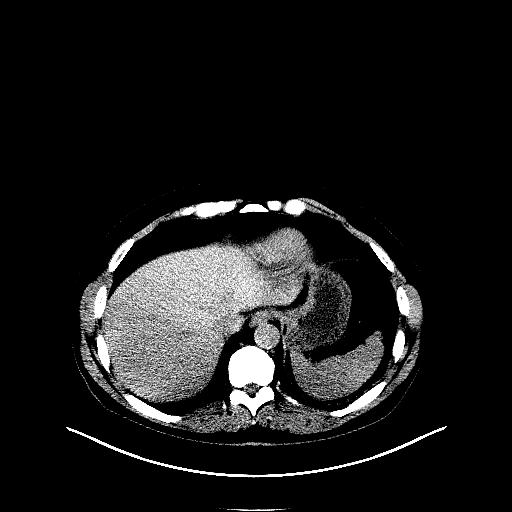

[2 of 25 positions shown; findings below may reference images not displayed]

FINDINGS: NECK

No radiotracer activity in neck lymph nodes.

Incidental CT finding: None

CHEST

No radiotracer accumulation within mediastinal or hilar lymph nodes.
No suspicious pulmonary nodules on the CT scan.

Incidental CT finding: None

ABDOMEN/PELVIS

Prostate: No focal activity in the prostate gland. Field
brachytherapy seeds throughout the gland.

Lymph nodes: No abnormal radiotracer accumulation within pelvic or
abdominal nodes.

Liver: No evidence of liver metastasis

Incidental CT finding: None

SKELETON

No focal activity to suggest skeletal metastasis. Anterior cervical
fusion
IMPRESSION: 1. No evidence local prostate cancer recurrence in the prostate
gland.
2. No evidence metastatic adenopathy in the pelvis or periaortic
retroperitoneum.
3. No evidence of visceral metastasis or skeletal metastasis.

## 2022-05-20 DIAGNOSIS — B029 Zoster without complications: Secondary | ICD-10-CM | POA: Diagnosis not present

## 2022-05-20 DIAGNOSIS — E291 Testicular hypofunction: Secondary | ICD-10-CM | POA: Diagnosis not present

## 2022-05-20 DIAGNOSIS — Z6825 Body mass index (BMI) 25.0-25.9, adult: Secondary | ICD-10-CM | POA: Diagnosis not present

## 2022-05-20 DIAGNOSIS — I10 Essential (primary) hypertension: Secondary | ICD-10-CM | POA: Diagnosis not present

## 2022-06-17 DIAGNOSIS — R3912 Poor urinary stream: Secondary | ICD-10-CM | POA: Diagnosis not present

## 2022-06-17 DIAGNOSIS — N401 Enlarged prostate with lower urinary tract symptoms: Secondary | ICD-10-CM | POA: Diagnosis not present

## 2022-06-19 DIAGNOSIS — R7303 Prediabetes: Secondary | ICD-10-CM | POA: Diagnosis not present

## 2022-06-19 DIAGNOSIS — Z1331 Encounter for screening for depression: Secondary | ICD-10-CM | POA: Diagnosis not present

## 2022-06-19 DIAGNOSIS — E559 Vitamin D deficiency, unspecified: Secondary | ICD-10-CM | POA: Diagnosis not present

## 2022-06-19 DIAGNOSIS — Z0001 Encounter for general adult medical examination with abnormal findings: Secondary | ICD-10-CM | POA: Diagnosis not present

## 2022-06-19 DIAGNOSIS — E538 Deficiency of other specified B group vitamins: Secondary | ICD-10-CM | POA: Diagnosis not present

## 2022-06-19 DIAGNOSIS — I10 Essential (primary) hypertension: Secondary | ICD-10-CM | POA: Diagnosis not present

## 2022-06-19 DIAGNOSIS — R7309 Other abnormal glucose: Secondary | ICD-10-CM | POA: Diagnosis not present

## 2022-06-19 DIAGNOSIS — Z6826 Body mass index (BMI) 26.0-26.9, adult: Secondary | ICD-10-CM | POA: Diagnosis not present

## 2022-06-19 DIAGNOSIS — E291 Testicular hypofunction: Secondary | ICD-10-CM | POA: Diagnosis not present

## 2022-06-24 DIAGNOSIS — E349 Endocrine disorder, unspecified: Secondary | ICD-10-CM | POA: Diagnosis not present

## 2022-06-24 DIAGNOSIS — N5201 Erectile dysfunction due to arterial insufficiency: Secondary | ICD-10-CM | POA: Diagnosis not present

## 2022-06-24 DIAGNOSIS — R3912 Poor urinary stream: Secondary | ICD-10-CM | POA: Diagnosis not present

## 2022-06-24 DIAGNOSIS — C61 Malignant neoplasm of prostate: Secondary | ICD-10-CM | POA: Diagnosis not present

## 2022-12-27 DIAGNOSIS — C61 Malignant neoplasm of prostate: Secondary | ICD-10-CM | POA: Diagnosis not present

## 2023-01-03 DIAGNOSIS — E349 Endocrine disorder, unspecified: Secondary | ICD-10-CM | POA: Diagnosis not present

## 2023-01-03 DIAGNOSIS — R3912 Poor urinary stream: Secondary | ICD-10-CM | POA: Diagnosis not present

## 2023-01-03 DIAGNOSIS — N5201 Erectile dysfunction due to arterial insufficiency: Secondary | ICD-10-CM | POA: Diagnosis not present

## 2023-01-03 DIAGNOSIS — C61 Malignant neoplasm of prostate: Secondary | ICD-10-CM | POA: Diagnosis not present

## 2023-07-15 DIAGNOSIS — Z1331 Encounter for screening for depression: Secondary | ICD-10-CM | POA: Diagnosis not present

## 2023-07-15 DIAGNOSIS — E663 Overweight: Secondary | ICD-10-CM | POA: Diagnosis not present

## 2023-07-15 DIAGNOSIS — E782 Mixed hyperlipidemia: Secondary | ICD-10-CM | POA: Diagnosis not present

## 2023-07-15 DIAGNOSIS — Z0001 Encounter for general adult medical examination with abnormal findings: Secondary | ICD-10-CM | POA: Diagnosis not present

## 2023-07-15 DIAGNOSIS — I10 Essential (primary) hypertension: Secondary | ICD-10-CM | POA: Diagnosis not present

## 2023-07-15 DIAGNOSIS — E7849 Other hyperlipidemia: Secondary | ICD-10-CM | POA: Diagnosis not present

## 2023-07-15 DIAGNOSIS — E291 Testicular hypofunction: Secondary | ICD-10-CM | POA: Diagnosis not present

## 2023-07-15 DIAGNOSIS — Z6826 Body mass index (BMI) 26.0-26.9, adult: Secondary | ICD-10-CM | POA: Diagnosis not present

## 2023-07-16 ENCOUNTER — Other Ambulatory Visit (HOSPITAL_COMMUNITY): Payer: Self-pay | Admitting: Internal Medicine

## 2023-07-16 DIAGNOSIS — R7989 Other specified abnormal findings of blood chemistry: Secondary | ICD-10-CM

## 2023-07-23 ENCOUNTER — Ambulatory Visit (HOSPITAL_COMMUNITY)
Admission: RE | Admit: 2023-07-23 | Discharge: 2023-07-23 | Disposition: A | Discharge status: Home / Self Care | Payer: PPO | Source: Ambulatory Visit | Attending: Internal Medicine | Admitting: Internal Medicine

## 2023-07-23 DIAGNOSIS — R7989 Other specified abnormal findings of blood chemistry: Secondary | ICD-10-CM | POA: Diagnosis not present

## 2024-01-09 DIAGNOSIS — C61 Malignant neoplasm of prostate: Secondary | ICD-10-CM | POA: Diagnosis not present

## 2024-01-16 DIAGNOSIS — C61 Malignant neoplasm of prostate: Secondary | ICD-10-CM | POA: Diagnosis not present

## 2024-01-16 DIAGNOSIS — R3912 Poor urinary stream: Secondary | ICD-10-CM | POA: Diagnosis not present

## 2024-12-09 ENCOUNTER — Ambulatory Visit
